# Patient Record
Sex: Male | Born: 1965
Health system: Southern US, Community
[De-identification: ages and names within clinical notes are randomized; demographics above are authoritative.]

## PROBLEM LIST (undated history)

## (undated) DIAGNOSIS — L309 Dermatitis, unspecified: Secondary | ICD-10-CM

## (undated) HISTORY — DX: Dermatitis, unspecified: L30.9

---

## 2016-10-19 ENCOUNTER — Encounter: Payer: Self-pay | Admitting: Medical

## 2016-10-19 ENCOUNTER — Ambulatory Visit (INDEPENDENT_AMBULATORY_CARE_PROVIDER_SITE_OTHER): Payer: Managed Care, Other (non HMO) | Admitting: Medical

## 2016-10-19 ENCOUNTER — Ambulatory Visit (HOSPITAL_BASED_OUTPATIENT_CLINIC_OR_DEPARTMENT_OTHER)
Admission: RE | Admit: 2016-10-19 | Discharge: 2016-10-19 | Disposition: A | Discharge status: Home / Self Care | Payer: Managed Care, Other (non HMO) | Source: Ambulatory Visit | Attending: Medical | Admitting: Medical

## 2016-10-19 VITALS — BP 148/90 | HR 91 | Temp 98.2°F | Resp 16 | Ht 72.0 in | Wt 243.6 lb

## 2016-10-19 DIAGNOSIS — M5136 Other intervertebral disc degeneration, lumbar region: Secondary | ICD-10-CM | POA: Diagnosis not present

## 2016-10-19 DIAGNOSIS — M5442 Lumbago with sciatica, left side: Secondary | ICD-10-CM

## 2016-10-19 DIAGNOSIS — G629 Polyneuropathy, unspecified: Secondary | ICD-10-CM | POA: Diagnosis not present

## 2016-10-19 DIAGNOSIS — R030 Elevated blood-pressure reading, without diagnosis of hypertension: Secondary | ICD-10-CM

## 2016-10-19 DIAGNOSIS — R5383 Other fatigue: Secondary | ICD-10-CM

## 2016-10-19 DIAGNOSIS — K429 Umbilical hernia without obstruction or gangrene: Secondary | ICD-10-CM

## 2016-10-19 DIAGNOSIS — F1010 Alcohol abuse, uncomplicated: Secondary | ICD-10-CM | POA: Diagnosis not present

## 2016-10-19 DIAGNOSIS — L309 Dermatitis, unspecified: Secondary | ICD-10-CM | POA: Diagnosis not present

## 2016-10-19 DIAGNOSIS — G8929 Other chronic pain: Secondary | ICD-10-CM

## 2016-10-19 LAB — CBC
HEMATOCRIT: 46.9 % (ref 39.0–52.0)
HEMOGLOBIN: 16.2 g/dL (ref 13.0–17.0)
MCHC: 34.6 g/dL (ref 30.0–36.0)
MCV: 97.7 fl (ref 78.0–100.0)
Platelets: 220 10*3/uL (ref 150.0–400.0)
RBC: 4.8 Mil/uL (ref 4.22–5.81)
RDW: 14 % (ref 11.5–15.5)
WBC: 8 10*3/uL (ref 4.0–10.5)

## 2016-10-19 LAB — TSH: TSH: 2.17 u[IU]/mL (ref 0.35–4.50)

## 2016-10-19 LAB — FOLATE: Folate: 5.7 ng/mL — ABNORMAL LOW (ref >5.9)

## 2016-10-19 LAB — VITAMIN B12: Vitamin B-12: 164 pg/mL — ABNORMAL LOW (ref 211–911)

## 2016-10-19 LAB — GAMMA GT: GGT: 76 U/L — AB (ref 7–51)

## 2016-10-19 NOTE — Patient Instructions (Addendum)
For your chronic  moderate to severe eczema history,  I am referring you to a dermatologist. You will probably need one of the newer type medications for eczema.  For your neuropathy and fatigue, I am ordering CBC, CMP, TSH, B12, B1, and folate level .  For your alcohol excess use/abuse recommend cutting back to 2-3 beers a day for short-term goal and possible complete abstinence. Will get blood work to assess liver enzymes.  For elevated blood pressure today recommend low salt diet and moderate exercise.  For your umbilical hernia will refer to general surgeon. If you get any severe constant high level pain in the hernia area then advised to be seen at the emergency department pending the surgeon referral  For your chronic midline lower back pain with radiating pain at times to the left leg will get lumbar spine x-ray. Will follow these x-rays and see if your pain worsens. If so might need to order MRI of the lumbar spine. If you ever gets severe leg pain, previous of legs, numbness between legs, foot drop or incontinence then be seen at the emergency department.  Follow-up in 2 weeks for blood pressure check. Get over-the-counter blood pressure cuff and check blood pressure daily. Try to avoid NSAIDs over-the-counter such as ibuprofen or Aleve since they can increase blood pressure. You can take Tylenol over-the-counter provided you decrease alcohol use and your liver enzymes on blood work are normal   DASH Eating Plan DASH stands for "Dietary Approaches to Stop Hypertension." The DASH eating plan is a healthy eating plan that has been shown to reduce high blood pressure (hypertension). It may also reduce your risk for type 2 diabetes, heart disease, and stroke. The DASH eating plan may also help with weight loss. What are tips for following this plan? General guidelines  Avoid eating more than 2,300 mg (milligrams) of salt (sodium) a day. If you have hypertension, you may need to reduce your  sodium intake to 1,500 mg a day.  Limit alcohol intake to no more than 1 drink a day for nonpregnant women and 2 drinks a day for men. One drink equals 12 oz of beer, 5 oz of wine, or 1 oz of hard liquor.  Work with your health care provider to maintain a healthy body weight or to lose weight. Ask what an ideal weight is for you.  Get at least 30 minutes of exercise that causes your heart to beat faster (aerobic exercise) most days of the week. Activities may include walking, swimming, or biking.  Work with your health care provider or diet and nutrition specialist (dietitian) to adjust your eating plan to your individual calorie needs. Reading food labels  Check food labels for the amount of sodium per serving. Choose foods with less than 5 percent of the Daily Value of sodium. Generally, foods with less than 300 mg of sodium per serving fit into this eating plan.  To find whole grains, look for the word "whole" as the first word in the ingredient list. Shopping  Buy products labeled as "low-sodium" or "no salt added."  Buy fresh foods. Avoid canned foods and premade or frozen meals. Cooking  Avoid adding salt when cooking. Use salt-free seasonings or herbs instead of table salt or sea salt. Check with your health care provider or pharmacist before using salt substitutes.  Do not fry foods. Cook foods using healthy methods such as baking, boiling, grilling, and broiling instead.  Cook with heart-healthy oils, such as olive, canola,  soybean, or sunflower oil. Meal planning   Eat a balanced diet that includes: ? 5 or more servings of fruits and vegetables each day. At each meal, try to fill half of your plate with fruits and vegetables. ? Up to 6-8 servings of whole grains each day. ? Less than 6 oz of lean meat, poultry, or fish each day. A 3-oz serving of meat is about the same size as a deck of cards. One egg equals 1 oz. ? 2 servings of low-fat dairy each day. ? A serving of  nuts, seeds, or beans 5 times each week. ? Heart-healthy fats. Healthy fats called Omega-3 fatty acids are found in foods such as flaxseeds and coldwater fish, like sardines, salmon, and mackerel.  Limit how much you eat of the following: ? Canned or prepackaged foods. ? Food that is high in trans fat, such as fried foods. ? Food that is high in saturated fat, such as fatty meat. ? Sweets, desserts, sugary drinks, and other foods with added sugar. ? Full-fat dairy products.  Do not salt foods before eating.  Try to eat at least 2 vegetarian meals each week.  Eat more home-cooked food and less restaurant, buffet, and fast food.  When eating at a restaurant, ask that your food be prepared with less salt or no salt, if possible. What foods are recommended? The items listed may not be a complete list. Talk with your dietitian about what dietary choices are best for you. Grains Whole-grain or whole-wheat bread. Whole-grain or whole-wheat pasta. Brown rice. Orpah Cobbatmeal. Quinoa. Bulgur. Whole-grain and low-sodium cereals. Pita bread. Low-fat, low-sodium crackers. Whole-wheat flour tortillas. Vegetables Fresh or frozen vegetables (raw, steamed, roasted, or grilled). Low-sodium or reduced-sodium tomato and vegetable juice. Low-sodium or reduced-sodium tomato sauce and tomato paste. Low-sodium or reduced-sodium canned vegetables. Fruits All fresh, dried, or frozen fruit. Canned fruit in natural juice (without added sugar). Meat and other protein foods Skinless chicken or Malawiturkey. Ground chicken or Malawiturkey. Pork with fat trimmed off. Fish and seafood. Egg whites. Dried beans, peas, or lentils. Unsalted nuts, nut butters, and seeds. Unsalted canned beans. Lean cuts of beef with fat trimmed off. Low-sodium, lean deli meat. Dairy Low-fat (1%) or fat-free (skim) milk. Fat-free, low-fat, or reduced-fat cheeses. Nonfat, low-sodium ricotta or cottage cheese. Low-fat or nonfat yogurt. Low-fat, low-sodium  cheese. Fats and oils Soft margarine without trans fats. Vegetable oil. Low-fat, reduced-fat, or light mayonnaise and salad dressings (reduced-sodium). Canola, safflower, olive, soybean, and sunflower oils. Avocado. Seasoning and other foods Herbs. Spices. Seasoning mixes without salt. Unsalted popcorn and pretzels. Fat-free sweets. What foods are not recommended? The items listed may not be a complete list. Talk with your dietitian about what dietary choices are best for you. Grains Baked goods made with fat, such as croissants, muffins, or some breads. Dry pasta or rice meal packs. Vegetables Creamed or fried vegetables. Vegetables in a cheese sauce. Regular canned vegetables (not low-sodium or reduced-sodium). Regular canned tomato sauce and paste (not low-sodium or reduced-sodium). Regular tomato and vegetable juice (not low-sodium or reduced-sodium). Rosita FirePickles. Olives. Fruits Canned fruit in a light or heavy syrup. Fried fruit. Fruit in cream or butter sauce. Meat and other protein foods Fatty cuts of meat. Ribs. Fried meat. Tomasa BlaseBacon. Sausage. Bologna and other processed lunch meats. Salami. Fatback. Hotdogs. Bratwurst. Salted nuts and seeds. Canned beans with added salt. Canned or smoked fish. Whole eggs or egg yolks. Chicken or Malawiturkey with skin. Dairy Whole or 2% milk, cream, and half-and-half. Whole  or full-fat cream cheese. Whole-fat or sweetened yogurt. Full-fat cheese. Nondairy creamers. Whipped toppings. Processed cheese and cheese spreads. Fats and oils Butter. Stick margarine. Lard. Shortening. Ghee. Bacon fat. Tropical oils, such as coconut, palm kernel, or palm oil. Seasoning and other foods Salted popcorn and pretzels. Onion salt, garlic salt, seasoned salt, table salt, and sea salt. Worcestershire sauce. Tartar sauce. Barbecue sauce. Teriyaki sauce. Soy sauce, including reduced-sodium. Steak sauce. Canned and packaged gravies. Fish sauce. Oyster sauce. Cocktail sauce. Horseradish  that you find on the shelf. Ketchup. Mustard. Meat flavorings and tenderizers. Bouillon cubes. Hot sauce and Tabasco sauce. Premade or packaged marinades. Premade or packaged taco seasonings. Relishes. Regular salad dressings. Where to find more information:  National Heart, Lung, and Blood Institute: PopSteam.is  American Heart Association: www.heart.org Summary  The DASH eating plan is a healthy eating plan that has been shown to reduce high blood pressure (hypertension). It may also reduce your risk for type 2 diabetes, heart disease, and stroke.  With the DASH eating plan, you should limit salt (sodium) intake to 2,300 mg a day. If you have hypertension, you may need to reduce your sodium intake to 1,500 mg a day.  When on the DASH eating plan, aim to eat more fresh fruits and vegetables, whole grains, lean proteins, low-fat dairy, and heart-healthy fats.  Work with your health care provider or diet and nutrition specialist (dietitian) to adjust your eating plan to your individual calorie needs. This information is not intended to replace advice given to you by your health care provider. Make sure you discuss any questions you have with your health care provider. Document Released: 01/25/2011 Document Revised: 01/30/2016 Document Reviewed: 01/30/2016 Elsevier Interactive Patient Education  2017 [employer].

## 2016-10-19 NOTE — Progress Notes (Signed)
Subjective:    Patient ID: Dennis Weaver, male    DOB: March 12, 1965, 51 y.o.   MRN: [id number]  HPI    Pt in for first time.  Pt works [employer],  Pt does not exercise. Pt not aware how many steps a day. Pt states he eats healthy. Non smoker. He does drink about 8 beers a day for about 20 years. Married- no children with current wife but kids with first marriage.  Hx of eczema on that occurs elbows, back of knee and anterior ankles. Pt had some prednisone in past and resolves briefly but then comes back. Conservative topical treatments did not help much. Pt was going to put him on new type med but then pt moved and never started treatments.  Pt states that his feet are numb at times. Pt numbness is for 3 years. Sometimes he almost looses balance since his feet are numb. No diabetes. Pt states blood work was negative 2 years ago. No calf cramping.   Some mild fatigue at times. Pt attributes to 3rd shift but he is not sure.   Bp mild elevated today. No cardiac or neurologic signs or symptoms.   Hx of back pain. State hx of back injury. Told in past disc issue. Hx of daily back pain. Pain for 2 years ago.   Hx of small umbliical hernia for 2 years. Occasional pain.     Review of Systems  Constitutional: Negative for chills, fatigue and fever.  Respiratory: Negative for cough, chest tightness, shortness of breath and wheezing.   Cardiovascular: Negative for chest pain and palpitations.  Gastrointestinal: Negative for abdominal pain, constipation, diarrhea, rectal pain and vomiting.  Neurological: Negative for dizziness, syncope, weakness, numbness and headaches.  Hematological: Negative for adenopathy. Does not bruise/bleed easily.       Objective:   Physical Exam   General Mental Status- Alert. General Appearance- Not in acute distress.   Skin General: Color- Normal Color. Moisture- Normal Moisture.  Neck Carotid Arteries- Normal color. Moisture- Normal  Moisture. No carotid bruits. No JVD.  Chest and Lung Exam Auscultation: Breath Sounds:-Normal.  Cardiovascular Auscultation:Rythm- Regular. Murmurs & Other Heart Sounds:Auscultation of the heart reveals- No Murmurs.  Abdomen Inspection:-Inspeection Normal. Palpation/Percussion:Note:No mass. Palpation and Percussion of the abdomen reveal- Non Tender, Non Distended + BS, no rebound or guarding. Small umbilical hernia.    Neurologic Cranial Nerve exam:- CN III-XII intact(No nystagmus), symmetric smile. Drift Test:- No drift. Romberg Exam:- Negative.  Heal to Toe Gait exam:-Normal. Finger to Nose:- Normal/Intact Strength:- 5/5 equal and symmetric strength both upper and lower extremities.  Back- mild mid lumbar spine tenderness to palpation. L5-S1 sensation intact bilaterally. But the bottom of his left foot is not numb. Right side only mildly numb.  Lower extremity-dorsalis pedis pulse and posterior tibial pulses intact. He does have some hair on both feet.     Assessment & Plan:  For your chronic  moderate to severe eczema history,  I am referring you to a dermatologist. You will probably need one of the newer type medications for eczema.  For your neuropathy and fatigue, I am ordering CBC, CMP, TSH, B12, B1, and folate level.  For your alcohol excess use/abuse recommend cutting back to 2-3 beers a day for short-term goal and possible complete abstinence. Will get blood work to assess liver enzymes.  For elevated blood pressure today recommend low salt diet and moderate exercise.  For your umbilical hernia will refer to general surgeon. If you get  any severe constant high level pain in the hernia area then advised to be seen at the emergency department pending the surgeon referral  For your chronic midline lower back pain with radiating pain at times to the left leg will get lumbar spine x-ray. Will follow these x-rays and see if your pain worsens. If so might need to order MRI  of the lumbar spine. If you ever gets severe leg pain, previous of legs, numbness between legs, foot drop or incontinence then be seen at the emergency department.  Follow-up in 2 weeks for blood pressure check. Get over-the-counter blood pressure cuff and check blood pressure daily. Try to avoid NSAIDs over-the-counter such as ibuprofen or Aleve since they can increase blood pressure. You can take Tylenol over-the-counter provided you decrease alcohol use and your liver enzymes on blood work are normal

## 2016-10-20 LAB — COMPLETE METABOLIC PANEL WITH GFR
ALT: 19 U/L (ref 9–46)
AST: 19 U/L (ref 10–35)
Albumin: 4.5 g/dL (ref 3.6–5.1)
Alkaline Phosphatase: 87 U/L (ref 40–115)
BILIRUBIN TOTAL: 0.4 mg/dL (ref 0.2–1.2)
BUN: 17 mg/dL (ref 7–25)
CO2: 22 mmol/L (ref 20–32)
Calcium: 9.8 mg/dL (ref 8.6–10.3)
Chloride: 103 mmol/L (ref 98–110)
Creat: 1.01 mg/dL (ref 0.70–1.33)
GFR, EST NON AFRICAN AMERICAN: 85 mL/min (ref >=60)
GFR, Est African American: >89 mL/min (ref >=60)
GLUCOSE: 87 mg/dL (ref 65–99)
Potassium: 4.3 mmol/L (ref 3.5–5.3)
SODIUM: 141 mmol/L (ref 135–146)
TOTAL PROTEIN: 7.6 g/dL (ref 6.1–8.1)

## 2016-10-23 LAB — VITAMIN B1: Vitamin B1 (Thiamine): 13 nmol/L (ref 8–30)

## 2016-10-25 ENCOUNTER — Ambulatory Visit (INDEPENDENT_AMBULATORY_CARE_PROVIDER_SITE_OTHER): Payer: Managed Care, Other (non HMO)

## 2016-10-25 DIAGNOSIS — E538 Deficiency of other specified B group vitamins: Secondary | ICD-10-CM | POA: Diagnosis not present

## 2016-10-25 MED ORDER — CYANOCOBALAMIN 1000 MCG/ML IJ SOLN
1000.0000 ug | Freq: Once | INTRAMUSCULAR | Status: AC
  Administered 2016-10-25: 1000 ug via INTRAMUSCULAR

## 2016-10-25 NOTE — Progress Notes (Addendum)
Pre visit review using our clinic tool,if applicable. No additional management support is needed unless otherwise documented below in the visit note.   Patient in for B12 injection per order from E. Saguier PA-C dated  10/23/16 due to patient having B-12 deficiency.  Given 1000 mcg IM left deltoid. Patient tolerated well. Return appointment given for 1 month per order.  Agree with administration of b12 im for low b12 level.  Saguier, Ramon DredgeEdward, PA-C

## 2016-11-05 ENCOUNTER — Encounter: Payer: Self-pay | Admitting: Medical

## 2016-11-05 ENCOUNTER — Ambulatory Visit (INDEPENDENT_AMBULATORY_CARE_PROVIDER_SITE_OTHER): Payer: Managed Care, Other (non HMO) | Admitting: Medical

## 2016-11-05 ENCOUNTER — Telehealth: Payer: Self-pay

## 2016-11-05 ENCOUNTER — Ambulatory Visit (HOSPITAL_BASED_OUTPATIENT_CLINIC_OR_DEPARTMENT_OTHER)
Admission: RE | Admit: 2016-11-05 | Discharge: 2016-11-05 | Disposition: A | Discharge status: Home / Self Care | Payer: Managed Care, Other (non HMO) | Source: Ambulatory Visit | Attending: Medical | Admitting: Medical

## 2016-11-05 VITALS — BP 150/90 | HR 98 | Temp 98.3°F | Resp 16 | Ht 74.0 in | Wt 244.0 lb

## 2016-11-05 DIAGNOSIS — M25559 Pain in unspecified hip: Secondary | ICD-10-CM

## 2016-11-05 DIAGNOSIS — M160 Bilateral primary osteoarthritis of hip: Secondary | ICD-10-CM | POA: Diagnosis not present

## 2016-11-05 DIAGNOSIS — I10 Essential (primary) hypertension: Secondary | ICD-10-CM

## 2016-11-05 MED ORDER — LOSARTAN POTASSIUM 50 MG PO TABS: 50.0000 mg | ORAL_TABLET | Freq: Every day | ORAL | 0 refills | Status: DC

## 2016-11-05 MED ORDER — TRAMADOL HCL 50 MG PO TABS: 50.0000 mg | ORAL_TABLET | Freq: Three times a day (TID) | ORAL | 0 refills | Status: DC | PRN

## 2016-11-05 MED FILL — traMADol HCL 50 MG TABS: 50 | 6 days supply | Qty: 16 | Fill #0

## 2016-11-05 MED FILL — LOSARTAN POTASSIUM 50 MG TA: 50 | 21 days supply | Qty: 21 | Fill #0

## 2016-11-05 NOTE — Telephone Encounter (Signed)
Faxed Tramadol to pt's pharmacy.

## 2016-11-05 NOTE — Telephone Encounter (Signed)
Pt states he forgot to ask PCP for medication neuropathy. Pt want to know if PCP can send something in today.

## 2016-11-05 NOTE — Progress Notes (Signed)
Subjective:    Patient ID: Dennis Weaver, male    DOB: 1965/06/03, 51 y.o.   MRN: [id number]  HPI  Pt in for a follow up. His bp was borderline 148/90.   Pt did get otc bp cuff. 138/87, 123/87,145/91, 122/83,121/82, 124/89, 135/105,134/98,134/93, 163/110 and 207/82. Today readings were elevated when MA and I checked. His bp in office with his machine was 140/111   Pt ha new machine from [facility].    Also notes some bilateral hip pain. Pain hurts a lot on standing. Rt side worse than left. No trauma or fall.   Review of Systems  Constitutional: Negative for chills, fatigue and fever.  Respiratory: Negative for cough, chest tightness and wheezing.   Cardiovascular: Negative for chest pain and palpitations.  Gastrointestinal: Negative for abdominal pain.  Musculoskeletal:       Rt hip pain.  Skin: Negative for rash.  Neurological: Negative for dizziness, speech difficulty, light-headedness and numbness.  Hematological: Negative for adenopathy. Does not bruise/bleed easily.  Psychiatric/Behavioral: Negative for behavioral problems, confusion, dysphoric mood, hallucinations and sleep disturbance. The patient is not nervous/anxious.     Past Medical History:  Diagnosis Date  . Eczema      Social History   Social History  . Marital status: Married    Spouse name: N/A  . Number of children: N/A  . Years of education: N/A   Occupational History  . Not on file.   Social History Main Topics  . Smoking status: Former Smoker    Packs/day: 0.50    Years: 20.00    Types: Cigarettes    Quit date: 10/19/2000  . Smokeless tobacco: Never Used  . Alcohol use 33.6 oz/week    56 Cans of beer per week  . Drug use: No  . Sexual activity: Yes   Other Topics Concern  . Not on file   Social History Narrative  . No narrative on file    No past surgical history on file.  Family History  Problem Relation Age of Onset  . Hepatitis Mother   . Cancer Father     No Known  Allergies  No current outpatient prescriptions on file prior to visit.   No current facility-administered medications on file prior to visit.     BP (!) 150/86   Pulse 98   Temp 98.3 F (36.8 C) (Oral)   Resp 16   Ht  (1.88 m)   Wt 244 lb (110.7 kg)   SpO2 99%   BMI 31.33 kg/m      Objective:   Physical Exam  General Mental Status- Alert. General Appearance- Not in acute distress.   Skin General: Color- Normal Color. Moisture- Normal Moisture.  Neck Carotid Arteries- Normal color. Moisture- Normal Moisture. No carotid bruits. No JVD.  Chest and Lung Exam Auscultation: Breath Sounds:-Normal.  Cardiovascular Auscultation:Rythm- Regular. Murmurs & Other Heart Sounds:Auscultation of the heart reveals- No Murmurs.  Abdomen Inspection:-Inspeection Normal. Palpation/Percussion:Note:No mass. Palpation and Percussion of the abdomen reveal- Non Tender, Non Distended + BS, no rebound or guarding.  Rt hip- pain on range of motion moderate. Stiff.  Left hip faint pain on range of motion.  Neurologic Cranial Nerve exam:- CN III-XII intact(No nystagmus), symmetric smile. Strength:- 5/5 equal and symmetric strength both upper and lower extremities.      Assessment & Plan:  For your high blood pressure today and other days when you check yourself ,  I will prescribe losartan  50 mg tablets.  Take tablet daily and check your blood pressure every other day. Please note some of your blood pressure readings are very low and only one was high. I will keep you on low-dose losartan into you follow-up. But during the interim if you see blood pressures over 140/90 consistently please go ahead and notify us.  For your hip pain please get x-rays. Knowing results of x-rays will help in determining what type of medication you would need. Currently use Tylenol only. Please avoid any NSAIDs.  After patient left, his x-rays did come back and showed severe osteoarthritis on right side  and moderate left hip osteoarthritis. With these findings I will advise him to continue Tylenol  But also prescribe tramadol. We'll go ahead and refer him to orthopedist.  He had mentioned some neuropathic type pain on the first visit with me. Tramadol may be helpful with this as well.    Follow-up in 2-3 weeks for nurse blood pressure check.  Ariston Grandison, Ramon Dredge, PA-C

## 2016-11-05 NOTE — Patient Instructions (Addendum)
For your high blood pressure today and other days when you check yourself ,  I will prescribe losartan  50 mg tablets.   Take tablet daily and check your blood pressure every other day. Please note some of your blood pressure readings are very low and only one was high. I will keep you on low-dose losartan into you follow-up. But during the interim if you see blood pressures over 140/90 consistently please go ahead and notify us.  For your hip pain please get x-rays. Knowing results of x-rays will help in determining what type of medication you would need. Currently use Tylenol only. Please avoid any NSAIDs.  After patient left, his x-rays did come back and showed severe osteoarthritis on right side and moderate left hip osteoarthritis. With these findings I will advise him to continue Tylenol  But also prescribe tramadol. We'll go ahead and refer him to orthopedist.  He had mentioned some neuropathic type pain on the first visit with me. Tramadol may be helpful with this as well.  Follow-up in 2-3 weeks for nurse blood pressure check.

## 2016-11-05 NOTE — Telephone Encounter (Signed)
Pt called in to follow up on request. I advised that we will be in touch once provider respond.   Please call pt when receive.

## 2016-11-07 ENCOUNTER — Telehealth: Payer: Self-pay | Admitting: Medical

## 2016-11-07 NOTE — Telephone Encounter (Signed)
Relation to UE:AVWU Call back number:[phone] Pharmacy:  Reason for call:  Patient states he works night and If possible please call within the hour, patient did state you can leave a detail message, please advise

## 2016-11-07 NOTE — Telephone Encounter (Signed)
Pt wants xray results. I sent result note. Will you notify him. Document he was advised.

## 2016-11-08 NOTE — Telephone Encounter (Signed)
Patient requesting call back, if he doesn't answer, you may leave detail message. Very anxious.

## 2016-11-08 NOTE — Telephone Encounter (Signed)
Pt.notified

## 2016-11-16 ENCOUNTER — Encounter (INDEPENDENT_AMBULATORY_CARE_PROVIDER_SITE_OTHER): Payer: Self-pay | Admitting: Orthopaedic Surgery

## 2016-11-16 ENCOUNTER — Ambulatory Visit (INDEPENDENT_AMBULATORY_CARE_PROVIDER_SITE_OTHER): Payer: Managed Care, Other (non HMO) | Admitting: Orthopaedic Surgery

## 2016-11-16 DIAGNOSIS — M1611 Unilateral primary osteoarthritis, right hip: Secondary | ICD-10-CM

## 2016-11-16 MED ORDER — DICLOFENAC SODIUM 75 MG PO TBEC: 75.0000 mg | DELAYED_RELEASE_TABLET | Freq: Two times a day (BID) | ORAL | 2 refills | Status: DC

## 2016-11-16 MED FILL — DICLOFENAC SOD EC 75 MG TAB: 75 | 15 days supply | Qty: 30 | Fill #0

## 2016-11-16 NOTE — Progress Notes (Signed)
Office Visit Note   Patient: Dennis Weaver           Date of Birth: [birth year]           MRN: [id number] Visit Date: 11/16/2016              Requested by: Esperanza Richters, PA-C [id number] [place], [place] [address] PCP: Esperanza Richters, PA-C   Assessment & Plan: Visit Diagnoses:  1. Primary osteoarthritis of right hip     Plan: Patient has severe degenerative joint disease of his right hip that has failed conservative treatment. At this point he has bone-on-bone changes on x-ray and significant difficulty with ADLs. He has constant pain. Diclofenac prescribed. Patient will call us back when he is ready to schedule surgery. We discussed the risks benefits alternatives to surgery. He understands and wishes to proceed.  Follow-Up Instructions: Return if symptoms worsen or fail to improve.   Orders:  No orders of the defined types were placed in this encounter.  Meds ordered this encounter  Medications  . diclofenac (VOLTAREN) 75 MG EC tablet    Sig: Take 1 tablet (75 mg total) by mouth 2 (two) times daily.    Dispense:  30 tablet    Refill:  2      Procedures: No procedures performed   Clinical Data: No additional findings.   Subjective: Chief Complaint  Patient presents with  . Right Hip - Pain  . Left Hip - Pain    Patient is a 51 year old gentleman who comes in with right greater than left hip pain for several years. He endorses significant pain and numbness and difficulty ambulating. He is not able to put on shoes because of hip pain. Tramadol has given partial and temporary relief. He has significant difficulty with ADLs and poor quality of life. 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    Review of Systems  Constitutional: Negative.   All other systems reviewed and are negative.    Objective: Vital Signs: There were no vitals taken for this visit.  Physical Exam  Constitutional: He is oriented to person,  place, and time. He appears well-developed and well-nourished.  HENT:  Head: Normocephalic and atraumatic.  Eyes: Pupils are equal, round, and reactive to light.  Neck: Neck supple.  Pulmonary/Chest: Effort normal.  Abdominal: Soft.  Musculoskeletal: Normal range of motion.  Neurological: He is alert and oriented to person, place, and time.  Skin: Skin is warm.  Psychiatric: He has a normal mood and affect. His behavior is normal. Judgment and thought content normal.  Nursing note and vitals reviewed.   Ortho Exam Right hip exam shows essentially no internal or external rotation. Positive Stinchfield sign. Lateral hip is nontender. Pain with logroll of the leg. Specialty Comments:  No specialty comments available.  Imaging: No results found.   PMFS History: There are no active problems to display for this patient.  Past Medical History:  Diagnosis Date  . Eczema     Family History  Problem Relation Age of Onset  . Hepatitis Mother   . Cancer Father     No past surgical history on file. Social History   Occupational History  . Not on file.   Social History Main Topics  . Smoking status: Former Smoker    Packs/day: 0.50    Years: 20.00    Types: Cigarettes    Quit date: 10/19/2000  . Smokeless tobacco: Never Used  .  Alcohol use 33.6 oz/week    56 Cans of beer per week  . Drug use: No  . Sexual activity: Yes

## 2016-11-22 ENCOUNTER — Ambulatory Visit: Payer: Managed Care, Other (non HMO)

## 2016-11-23 ENCOUNTER — Ambulatory Visit (INDEPENDENT_AMBULATORY_CARE_PROVIDER_SITE_OTHER): Payer: Managed Care, Other (non HMO) | Admitting: Medical

## 2016-11-23 VITALS — BP 138/89 | HR 78

## 2016-11-23 DIAGNOSIS — E538 Deficiency of other specified B group vitamins: Secondary | ICD-10-CM | POA: Diagnosis not present

## 2016-11-23 DIAGNOSIS — I10 Essential (primary) hypertension: Secondary | ICD-10-CM

## 2016-11-23 MED ORDER — CYANOCOBALAMIN 1000 MCG/ML IJ SOLN
1000.0000 ug | Freq: Once | INTRAMUSCULAR | Status: AC
  Administered 2016-11-23: 1000 ug via INTRAMUSCULAR

## 2016-11-23 MED ORDER — LOSARTAN POTASSIUM 50 MG PO TABS: 50.0000 mg | ORAL_TABLET | Freq: Every day | ORAL | 0 refills | Status: DC

## 2016-11-23 MED FILL — LOSARTAN POTASSIUM 50 MG TA: 50 | 90 days supply | Qty: 90 | Fill #0

## 2016-11-23 NOTE — Patient Instructions (Signed)
Per Esperanza Richters, PA-C: Continue current medication & regimen. Follow-up with PCP in 3 months.

## 2016-11-23 NOTE — Progress Notes (Signed)
Pre visit review using our clinic review tool, if applicable. No additional management support is needed unless otherwise documented below in the visit note.  Patient presents in office for blood pressure check & B12 injection per OV note 11/05/16 & Lab note 10/19/16. He voiced adherence to medication. Patient is asymptomatic; denies headaches, dizziness & lightheadedness. Today's readings were as follow: BP 138/89 P 78 & O2 97%.  Per Esperanza Richters, PA-C: Continue current medication & regimen. Follow-up with PCP in 3 months.  Patient was made aware of the provider's recommendations & verbalized understanding. Next appointments are 12/25/16 for B12 injection & 02/25/17 for BP follow-up.  Reviewed with RN and administration of B12 and agree with injection. Also discussed patient's blood pressure readings today and plan going forward.  Saguier, Ramon Dredge, PA-C

## 2016-11-30 MED FILL — DICLOFENAC SODIUM 75 MG TAB: 75 | 15 days supply | Qty: 30 | Fill #1

## 2016-12-17 ENCOUNTER — Telehealth (INDEPENDENT_AMBULATORY_CARE_PROVIDER_SITE_OTHER): Payer: Self-pay | Admitting: Orthopaedic Surgery

## 2016-12-17 MED FILL — DICLOFENAC SODIUM 75 MG TAB: 75 | 15 days supply | Qty: 30 | Fill #2

## 2016-12-17 NOTE — Telephone Encounter (Signed)
We can prescribe oral diclofenac for him.  75 mg bid.  Has he thought more about having a hip replacement?

## 2016-12-17 NOTE — Telephone Encounter (Signed)
Patient called saying that he was prescribed Voltaren for his hip pain which he said helped temporarily but now he's in more pain than he was in before and he was wondering if he could possibly take something else or if he needed to be seen. CB # [phone]

## 2016-12-17 NOTE — Telephone Encounter (Signed)
Please advise 

## 2016-12-19 NOTE — Telephone Encounter (Signed)
Called patient no answer.LMOM.  Need a pharmacy so I can send in medication.  And also has he thought about having the hip replacement?

## 2016-12-19 NOTE — Telephone Encounter (Signed)
Tried to call patient again no answer. Will call again later.

## 2016-12-20 MED ORDER — TRAMADOL HCL 50 MG PO TABS: ORAL_TABLET | ORAL | 0 refills | Status: DC

## 2016-12-20 MED FILL — traMADol HCL 50 MG TABS: 50 | 15 days supply | Qty: 60 | Fill #0

## 2016-12-20 NOTE — Telephone Encounter (Signed)
See message.

## 2016-12-20 NOTE — Telephone Encounter (Signed)
[facility]ed into [facility]. Pharmacy will call pt when ready.

## 2016-12-20 NOTE — Telephone Encounter (Signed)
Tramadol 50-100 mg bid prn #60

## 2016-12-20 NOTE — Addendum Note (Signed)
Addended by: Albertina ParrGARCIA, Shakeisha Horine on: 12/20/2016 01:20 PM   Modules accepted: Orders

## 2016-12-20 NOTE — Telephone Encounter (Signed)
Patient returned your call.  He stated that the Voltaren did not work for him and wanted to know if he could get something else.  He uses [facility] on [address].  He has thought about the hip replacement but can not take off for 3 months.  He is trying to work that out.  He works nights.  Okay to leave voicemail.  [phone].  Thank you

## 2016-12-25 ENCOUNTER — Ambulatory Visit: Payer: Managed Care, Other (non HMO)

## 2017-01-03 ENCOUNTER — Ambulatory Visit: Payer: Managed Care, Other (non HMO)

## 2017-01-16 ENCOUNTER — Ambulatory Visit (INDEPENDENT_AMBULATORY_CARE_PROVIDER_SITE_OTHER): Payer: Managed Care, Other (non HMO)

## 2017-01-16 DIAGNOSIS — E538 Deficiency of other specified B group vitamins: Secondary | ICD-10-CM | POA: Diagnosis not present

## 2017-01-16 MED ORDER — CYANOCOBALAMIN 1000 MCG/ML IJ SOLN
1000.0000 ug | Freq: Once | INTRAMUSCULAR | Status: AC
  Administered 2017-01-16: 1000 ug via INTRAMUSCULAR

## 2017-01-16 NOTE — Progress Notes (Signed)
Pre visit review using our clinic tool,if applicable. No additional management support is needed unless otherwise documented below in the visit note.   Patient in for B12 injection per [facility] from [facility] S[facility],PA-C due to patient having B12 deficiency.   No complaints voiced this visit. Given 1000 mcg IM left deltoid.  Patient tolerated well.

## 2017-02-15 ENCOUNTER — Ambulatory Visit: Payer: Managed Care, Other (non HMO)

## 2017-02-25 ENCOUNTER — Encounter: Payer: Self-pay | Admitting: Medical

## 2017-02-25 ENCOUNTER — Telehealth: Payer: Self-pay | Admitting: Medical

## 2017-02-25 ENCOUNTER — Ambulatory Visit: Payer: Managed Care, Other (non HMO) | Admitting: Medical

## 2017-02-25 VITALS — BP 140/90 | HR 82 | Temp 98.3°F | Resp 16 | Ht 74.0 in | Wt 232.4 lb

## 2017-02-25 DIAGNOSIS — Z1211 Encounter for screening for malignant neoplasm of colon: Secondary | ICD-10-CM

## 2017-02-25 DIAGNOSIS — M25559 Pain in unspecified hip: Secondary | ICD-10-CM | POA: Diagnosis not present

## 2017-02-25 DIAGNOSIS — I10 Essential (primary) hypertension: Secondary | ICD-10-CM | POA: Diagnosis not present

## 2017-02-25 MED ORDER — DICLOFENAC SODIUM 75 MG PO TBEC: 75.0000 mg | DELAYED_RELEASE_TABLET | Freq: Two times a day (BID) | ORAL | 2 refills | Status: AC

## 2017-02-25 MED ORDER — LOSARTAN POTASSIUM 100 MG PO TABS: 100.0000 mg | ORAL_TABLET | Freq: Every day | ORAL | 3 refills | Status: DC

## 2017-02-25 MED FILL — LOSARTAN POTASSIUM 100 MG T: 100 | 90 days supply | Qty: 90 | Fill #0

## 2017-02-25 MED FILL — DICLOFENAC SODIUM 75 MG TAB: 75 | 15 days supply | Qty: 30 | Fill #0

## 2017-02-25 NOTE — Telephone Encounter (Signed)
Copied from CRM [id number]. Topic: Quick Communication - See Telephone Encounter >> Feb 25, 2017  9:31 AM Windy KalataMichael, Deaaron Fulghum L, NT wrote: CRM for notification. See Telephone encounter for:  02/25/17.  Patient states he was seen today and Dr. Alvira MondaySaguier refilled all medications except his tramadol. Patient would like that called into [facility] [place] [facility].

## 2017-02-25 NOTE — Patient Instructions (Addendum)
For your hypertension consistently high will rx losartan 100 mg a day. Your bp has been consistently high and you did not take former rx. Please take this time.  For hip pain will rx diclofenac. Please be aware this can increase bp transiently so again please take bp med.  Pt declines flu vaccine today.  Hopefully you will not loose your job and if so will find one quickly. If you want to proceed with health maintenance let me know. Will go ahead and put in GI referral after discussion.  Follow up 3 months or as needed

## 2017-02-25 NOTE — Progress Notes (Signed)
Subjective:    Patient ID: Dennis Weaver, male    DOB: 18-Dec-1965, 52 y.o.   MRN: [id number]  HPI  Pt in for bp check.   His bp initially is borderline high. In past as well. No gross motor or sensory function deficits.   I rx'd cozaar and he did not take.  Pt has severe osteoarthritis of rt hip. Pt could not get surgery due to new job and he might be getting layed off. So surgery will likely be delayed.      Review of Systems  Constitutional: Negative for chills, fatigue and fever.  Respiratory: Negative for cough, chest tightness, shortness of breath and wheezing.   Cardiovascular: Negative for chest pain and palpitations.  Gastrointestinal: Negative for abdominal pain, diarrhea, nausea and vomiting.  Musculoskeletal: Negative for back pain and joint swelling.       Hip pains.  Skin: Negative for rash.  Neurological: Negative for dizziness, seizures, speech difficulty, weakness and headaches.  Hematological: Negative for adenopathy. Does not bruise/bleed easily.  Psychiatric/Behavioral: Negative for behavioral problems and decreased concentration.   Past Medical History:  Diagnosis Date  . Eczema      Social History   Socioeconomic History  . Marital status: Married    Spouse name: Not on file  . Number of children: Not on file  . Years of education: Not on file  . Highest education level: Not on file  Social Needs  . Financial resource strain: Not on file  . Food insecurity - worry: Not on file  . Food insecurity - inability: Not on file  . Transportation needs - medical: Not on file  . Transportation needs - non-medical: Not on file  Occupational History  . Not on file  Tobacco Use  . Smoking status: Former Smoker    Packs/day: 0.50    Years: 20.00    Pack years: 10.00    Types: Cigarettes    Last attempt to quit: 10/19/2000    Years since quitting: 16.3  . Smokeless tobacco: Never Used  Substance and Sexual Activity  . Alcohol use: Yes   Alcohol/week: 33.6 oz    Types: 56 Cans of beer per week  . Drug use: No  . Sexual activity: Yes  Other Topics Concern  . Not on file  Social History Narrative  . Not on file    No past surgical history on file.  Family History  Problem Relation Age of Onset  . Hepatitis Mother   . Cancer Father     No Known Allergies  Current Outpatient Medications on File Prior to Visit  Medication Sig Dispense Refill  . diclofenac (VOLTAREN) 75 MG EC tablet Take 1 tablet (75 mg total) by mouth 2 (two) times daily. 30 tablet 2  . losartan (COZAAR) 50 MG tablet Take 1 tablet (50 mg total) by mouth daily. 90 tablet 0  . traMADol (ULTRAM) 50 MG tablet Tramadol 50-100 mg bid prn 60 tablet 0   No current facility-administered medications on file prior to visit.     BP (!) 142/91   Pulse 82   Temp 98.3 F (36.8 C) (Oral)   Resp 16   Ht 6\' 2"  (1.88 m)   Wt 232 lb 6.4 oz (105.4 kg)   SpO2 99%   BMI 29.84 kg/m       Objective:   Physical Exam  General Mental Status- Alert. General Appearance- Not in acute distress.   Skin General: Color- Normal Color. Moisture- Normal Moisture.  Neck Carotid Arteries- Normal color. Moisture- Normal Moisture. No carotid bruits. No JVD.  Chest and Lung Exam Auscultation: Breath Sounds:-Normal.  Cardiovascular Auscultation:Rythm- Regular. Murmurs & Other Heart Sounds:Auscultation of the heart reveals- No Murmurs.  Abdomen Inspection:-Inspeection Normal. Palpation/Percussion:Note:No mass. Palpation and Percussion of the abdomen reveal- Non Tender, Non Distended + BS, no rebound or guarding.    Neurologic Cranial Nerve exam:- CN III-XII intact(No nystagmus), symmetric smile. Strength:- 5/5 equal and symmetric strength both upper and lower extremities.      Assessment & Plan:  For your hypertension consistently high will rx losartan 100 mg a day. Your bp has been consistently high and you did not take former rx. Please take this  time.  For hip pain will rx diclofenac. Please be aware this can increase bp transiently so again please take bp med.  Pt declines flu vaccine today.  Hopefully you will not loose your job and if so will find one quickly. If you want to proceed with health maintenance let me know. Will go ahead and put in GI referral after discussion.  Follow up 3 months or as needed  Santanna Whitford, Ramon DredgeEdward, VF CorporationPA-C

## 2017-02-26 NOTE — Telephone Encounter (Signed)
Let pt know I want him to come in and give uds and sign contract. Put in uds order and arrange for him to get that done this week. I can give him the tramadol script once he gives uds and signs contract(also run his [employer] profil). Also remind him that once on pain meds chronically he needs to be seen at least every 3 months.   Otherwise, explain I can only give him 5 day supply.

## 2017-02-28 NOTE — Telephone Encounter (Signed)
Left pt a message to call back. 

## 2017-03-04 ENCOUNTER — Other Ambulatory Visit: Payer: Managed Care, Other (non HMO)

## 2017-03-20 MED FILL — DICLOFENAC SODIUM 75 MG TAB: 75 | 15 days supply | Qty: 30 | Fill #1

## 2017-03-29 ENCOUNTER — Encounter: Payer: Self-pay | Admitting: Medical

## 2017-08-06 MED FILL — DICLOFENAC SOD EC 75 MG TAB: 75 | 15 days supply | Qty: 30 | Fill #2

## 2017-08-14 ENCOUNTER — Ambulatory Visit (INDEPENDENT_AMBULATORY_CARE_PROVIDER_SITE_OTHER): Payer: Self-pay | Admitting: Family

## 2017-08-14 ENCOUNTER — Encounter: Payer: Self-pay | Admitting: Family

## 2017-08-14 VITALS — BP 157/90 | HR 84 | Temp 97.9°F | Resp 16 | Ht 74.0 in | Wt 230.0 lb

## 2017-08-14 DIAGNOSIS — S39012A Strain of muscle, fascia and tendon of lower back, initial encounter: Secondary | ICD-10-CM

## 2017-08-14 DIAGNOSIS — L03032 Cellulitis of left toe: Secondary | ICD-10-CM

## 2017-08-14 DIAGNOSIS — R21 Rash and other nonspecific skin eruption: Secondary | ICD-10-CM

## 2017-08-14 DIAGNOSIS — I10 Essential (primary) hypertension: Secondary | ICD-10-CM

## 2017-08-14 MED ORDER — CEPHALEXIN 500 MG PO CAPS: 500.0000 mg | ORAL_CAPSULE | Freq: Three times a day (TID) | ORAL | 0 refills | Status: DC

## 2017-08-14 MED ORDER — CEPHALEXIN 500 MG PO CAPS: 500.0000 mg | ORAL_CAPSULE | Freq: Three times a day (TID) | ORAL | 0 refills | Status: AC

## 2017-08-14 MED ORDER — PREDNISONE 10 MG PO TABS: ORAL_TABLET | ORAL | 0 refills | Status: DC

## 2017-08-14 MED ORDER — LOSARTAN POTASSIUM 100 MG PO TABS: 100.0000 mg | ORAL_TABLET | Freq: Every day | ORAL | 3 refills | Status: DC

## 2017-08-14 MED ORDER — LOSARTAN POTASSIUM 100 MG PO TABS: 100.0000 mg | ORAL_TABLET | Freq: Every day | ORAL | 3 refills | Status: AC

## 2017-08-14 MED ORDER — FLUCONAZOLE 150 MG PO TABS: ORAL_TABLET | ORAL | 0 refills | Status: DC

## 2017-08-14 MED FILL — CEPHALEXIN 500 MG CAPSULE: 500 | 7 days supply | Qty: 21 | Fill #0

## 2017-08-14 MED FILL — predniSONE 10 MG TABS: 10 | 8 days supply | Qty: 20 | Fill #0

## 2017-08-14 MED FILL — LOSARTAN POTASSIUM 100 MG T: 100 | 30 days supply | Qty: 30 | Fill #0

## 2017-08-14 MED FILL — FLUCONAZOLE 150 MG TABS: 150 | 7 days supply | Qty: 2 | Fill #0

## 2017-08-14 NOTE — Progress Notes (Signed)
Subjective:    Patient ID: Dennis Weaver, male    DOB: 1965/08/24, 52 y.o.   MRN: [id number]  HPI   Dennis Weaver is a 52 yr old male who presents today with several concerns:  1) Foot pain-patient reports that he has been wearing steel toe boots recently at work. This has caused his "eczema" to flare up.    2) Back Pain- was picking his leg up and felt some pain in the lower back. This occurred yesterday. Pain is non-radiating.  3) Weight loss- reports improved diet.  Pt reports 30 pound weight loss over the last year. Our records indicate 14 pounds.  Wt Readings from Last 3 Encounters:  08/14/17 230 lb (104.3 kg)  02/25/17 232 lb 6.4 oz (105.4 kg)  11/05/16 244 lb (110.7 kg)   4) HTN- stopped bp med due to unemployment. He remains uninsured. BP Readings from Last 3 Encounters:  08/14/17 (!) 157/90  02/25/17 140/90  11/23/16 138/89      Review of Systems See HPI  Past Medical History:  Diagnosis Date  . Eczema      Social History   Socioeconomic History  . Marital status: Married    Spouse name: Not on file  . Number of children: Not on file  . Years of education: Not on file  . Highest education level: Not on file  Occupational History  . Not on file  Social Needs  . Financial resource strain: Not on file  . Food insecurity:    Worry: Not on file    Inability: Not on file  . Transportation needs:    Medical: Not on file    Non-medical: Not on file  Tobacco Use  . Smoking status: Former Smoker    Packs/day: 0.50    Years: 20.00    Pack years: 10.00    Types: Cigarettes    Last attempt to quit: 10/19/2000    Years since quitting: 16.8  . Smokeless tobacco: Never Used  Substance and Sexual Activity  . Alcohol use: Yes    Alcohol/week: 33.6 oz    Types: 56 Cans of beer per week  . Drug use: No  . Sexual activity: Yes  Lifestyle  . Physical activity:    Days per week: Not on file    Minutes per session: Not on file  . Stress: Not on file    Relationships  . Social connections:    Talks on phone: Not on file    Gets together: Not on file    Attends religious service: Not on file    Active member of club or organization: Not on file    Attends meetings of clubs or organizations: Not on file    Relationship status: Not on file  . Intimate partner violence:    Fear of current or ex partner: Not on file    Emotionally abused: Not on file    Physically abused: Not on file    Forced sexual activity: Not on file  Other Topics Concern  . Not on file  Social History Narrative  . Not on file    No past surgical history on file.  Family History  Problem Relation Age of Onset  . Hepatitis Mother   . Cancer Father     No Known Allergies  Current Outpatient Medications on File Prior to Visit  Medication Sig Dispense Refill  . diclofenac (VOLTAREN) 75 MG EC tablet Take 1 tablet (75 mg total) by mouth 2 (two) times  daily. 30 tablet 2  . losartan (COZAAR) 100 MG tablet Take 1 tablet (100 mg total) by mouth daily. (Patient not taking: Reported on 08/14/2017) 90 tablet 3  . traMADol (ULTRAM) 50 MG tablet Tramadol 50-100 mg bid prn (Patient not taking: Reported on 08/14/2017) 60 tablet 0   No current facility-administered medications on file prior to visit.     BP (!) 157/90 (BP Location: Right Arm, Cuff Size: Large)   Pulse 84   Temp 97.9 F (36.6 C) (Oral)   Resp 16   Ht 6\' 2"  (1.88 m)   Wt 230 lb (104.3 kg)   SpO2 97%   BMI 29.53 kg/m       Objective:   Physical Exam  Constitutional: He is oriented to person, place, and time. He appears well-developed and well-nourished. No distress.  HENT:  Head: Normocephalic and atraumatic.  Cardiovascular: Normal rate and regular rhythm.  No murmur heard. Pulmonary/Chest: Effort normal and breath sounds normal. No respiratory distress. He has no wheezes. He has no rales.  Musculoskeletal: He exhibits no edema.  Neurological: He is alert and oriented to person, place, and  time.  Skin: Skin is warm and dry.  erthematous annular plaques noted on bilateral dorsal feet Left second toe is swollen and erythematous with absent toenail, some serous drainage is noted.   Dry scaling patch noted on right upper arm  Psychiatric: He has a normal mood and affect. His behavior is normal. Thought content normal.          Assessment & Plan:  Skin rash- suspect foot rash is secondary to tinea pedis as well as severe eczema.  Psoriasis is another consideration and I have advised the patient that he should see a dermatologist when his insurance becomes active. Reports that he has responded to oral steroids in the past for his eczema.  Given associated back pain will rx with a prednisone taper. For suspected associated tinea pedis- rx with diflucan.  Cellulitis- left second toe. Rx with keflex. Recommended x-ray to rule out osteo.  He declines at this time due to cost.  Lumbar strain- rx with prednisone taper.  HTN- uncontrolled. Restart losartan, will need bmet with his insurance becomes active in August.

## 2017-08-14 NOTE — Patient Instructions (Signed)
Begin keflex for skin infection on your foot. Begin prednisone for back pain and eczema. Begin diflucan for fungal infection on your feet. Call if increased pain/swelling or if if you develop fever. Restart losartan for your blood pressure.  Follow up with Ramon DredgeEdward in 1 week.

## 2017-08-21 ENCOUNTER — Ambulatory Visit (INDEPENDENT_AMBULATORY_CARE_PROVIDER_SITE_OTHER): Payer: Self-pay | Admitting: Medical

## 2017-08-21 ENCOUNTER — Ambulatory Visit (HOSPITAL_BASED_OUTPATIENT_CLINIC_OR_DEPARTMENT_OTHER)
Admission: RE | Admit: 2017-08-21 | Discharge: 2017-08-21 | Disposition: A | Discharge status: Home / Self Care | Payer: Self-pay | Source: Ambulatory Visit | Attending: Medical | Admitting: Medical

## 2017-08-21 ENCOUNTER — Encounter: Payer: Self-pay | Admitting: Medical

## 2017-08-21 VITALS — BP 151/95 | HR 83 | Temp 98.2°F | Resp 16 | Ht 74.0 in | Wt 236.2 lb

## 2017-08-21 DIAGNOSIS — T148XXA Other injury of unspecified body region, initial encounter: Secondary | ICD-10-CM | POA: Insufficient documentation

## 2017-08-21 DIAGNOSIS — L0390 Cellulitis, unspecified: Secondary | ICD-10-CM | POA: Insufficient documentation

## 2017-08-21 DIAGNOSIS — L089 Local infection of the skin and subcutaneous tissue, unspecified: Secondary | ICD-10-CM | POA: Insufficient documentation

## 2017-08-21 DIAGNOSIS — X58XXXA Exposure to other specified factors, initial encounter: Secondary | ICD-10-CM | POA: Insufficient documentation

## 2017-08-21 DIAGNOSIS — I10 Essential (primary) hypertension: Secondary | ICD-10-CM

## 2017-08-21 LAB — COMPREHENSIVE METABOLIC PANEL
ALBUMIN: 4 g/dL (ref 3.5–5.2)
ALT: 30 U/L (ref 0–53)
AST: 26 U/L (ref 0–37)
Alkaline Phosphatase: 95 U/L (ref 39–117)
BILIRUBIN TOTAL: 0.4 mg/dL (ref 0.2–1.2)
BUN: 14 mg/dL (ref 6–23)
CO2: 27 meq/L (ref 19–32)
CREATININE: 0.75 mg/dL (ref 0.40–1.50)
Calcium: 9.9 mg/dL (ref 8.4–10.5)
Chloride: 100 mEq/L (ref 96–112)
GFR: 115.84 mL/min (ref >60.00)
Glucose, Bld: 112 mg/dL — ABNORMAL HIGH (ref 70–99)
Potassium: 5.1 mEq/L (ref 3.5–5.1)
Sodium: 136 mEq/L (ref 135–145)
Total Protein: 7.9 g/dL (ref 6.0–8.3)

## 2017-08-21 LAB — CBC WITH DIFFERENTIAL/PLATELET
BASOS ABS: 0.1 10*3/uL (ref 0.0–0.1)
BASOS PCT: 1 % (ref 0.0–3.0)
Eosinophils Absolute: 0.2 10*3/uL (ref 0.0–0.7)
Eosinophils Relative: 1.8 % (ref 0.0–5.0)
HEMATOCRIT: 43.2 % (ref 39.0–52.0)
HEMOGLOBIN: 14.6 g/dL (ref 13.0–17.0)
LYMPHS PCT: 17.4 % (ref 12.0–46.0)
Lymphs Abs: 1.5 10*3/uL (ref 0.7–4.0)
MCHC: 33.8 g/dL (ref 30.0–36.0)
MCV: 101.5 fl — AB (ref 78.0–100.0)
MONOS PCT: 9.6 % (ref 3.0–12.0)
Monocytes Absolute: 0.8 10*3/uL (ref 0.1–1.0)
NEUTROS ABS: 6.1 10*3/uL (ref 1.4–7.7)
Neutrophils Relative %: 70.2 % (ref 43.0–77.0)
PLATELETS: 306 10*3/uL (ref 150.0–400.0)
RBC: 4.26 Mil/uL (ref 4.22–5.81)
RDW: 16 % — AB (ref 11.5–15.5)
WBC: 8.7 10*3/uL (ref 4.0–10.5)

## 2017-08-21 MED ORDER — FLUCONAZOLE 150 MG PO TABS: ORAL_TABLET | ORAL | 0 refills | Status: AC

## 2017-08-21 MED ORDER — DOXYCYCLINE HYCLATE 100 MG PO TABS: 100.0000 mg | ORAL_TABLET | Freq: Two times a day (BID) | ORAL | 0 refills | Status: AC

## 2017-08-21 MED ORDER — SULFAMETHOXAZOLE-TRIMETHOPRIM 800-160 MG PO TABS: 1.0000 | ORAL_TABLET | Freq: Two times a day (BID) | ORAL | 0 refills | Status: AC

## 2017-08-21 MED ORDER — PREDNISONE 10 MG PO TABS: ORAL_TABLET | ORAL | 0 refills | Status: AC

## 2017-08-21 MED FILL — SULFAMETHOXAZOLE-TMP DS TAB: 800-160 | 7 days supply | Qty: 14 | Fill #0

## 2017-08-21 MED FILL — FLUCONAZOLE 150 MG TABS: 150 | 7 days supply | Qty: 2 | Fill #0

## 2017-08-21 MED FILL — DOXYCYCLINE HYCLATE 100 MG: 100 | 7 days supply | Qty: 14 | Fill #0

## 2017-08-21 MED FILL — predniSONE 10 MG TABS: 10 | 5 days supply | Qty: 15 | Fill #0

## 2017-08-21 NOTE — Patient Instructions (Addendum)
You do appear to have severe rash.  Possible severe eczema with secondary infection. You do report some improvement compared to last visit per your report.  However peeling of the skin today when you took your shoes off.  By exam concerned that left foot appears infected.  Also concern for breakdown/open wound at the tip of left second toe.  I counseled on benefit of referral to specialist but you had concern about cost.  My thought was probably best to refer you to wound care in light of physical exam.  If you change your mind please let me know.  We will prescribe both Bactrim DS and doxycycline antibiotic.  Wound culture sent out today.  Will order CBC and CMP today.  Also provide 5-day taper dosing of prednisone.  If your wounds worsen as we discussed today then recommend ED evaluation.  Follow-up on Monday with me.  If areas are not improving will again need to reconsider referral to specialist.  Understand your concern for cost but we need to proceed with caution.

## 2017-08-21 NOTE — Progress Notes (Signed)
Subjective:    Patient ID: Dennis Weaver, male    DOB: 27-Oct-1965, 52 y.o.   MRN: [id number]  HPI  Pt in with severe rash to both feet.   He states recent walking a lot at work and feet were sweating a lot.   He developed severe rash for over a month.(Reviewed NP note today)  2nd week of work the rash started. He was wearing new steel toe work boots.  Pt was treated for possible severe eczema and possible cellulitis.  He declined getting xray due to cost.     Review of Systems  Constitutional: Negative for chills, fatigue and fever.  Respiratory: Negative for chest tightness.   Musculoskeletal: Negative for back pain.       See foot exam.  Skin: Negative for pallor and rash.       See hpi and PE  Neurological: Negative for syncope, facial asymmetry, speech difficulty, weakness, light-headedness and numbness.  Hematological: Negative for adenopathy. Does not bruise/bleed easily.  Psychiatric/Behavioral: Negative for behavioral problems.    Past Medical History:  Diagnosis Date  . Eczema      Social History   Socioeconomic History  . Marital status: Married    Spouse name: Not on file  . Number of children: Not on file  . Years of education: Not on file  . Highest education level: Not on file  Occupational History  . Not on file  Social Needs  . Financial resource strain: Not on file  . Food insecurity:    Worry: Not on file    Inability: Not on file  . Transportation needs:    Medical: Not on file    Non-medical: Not on file  Tobacco Use  . Smoking status: Former Smoker    Packs/day: 0.50    Years: 20.00    Pack years: 10.00    Types: Cigarettes    Last attempt to quit: 10/19/2000    Years since quitting: 16.8  . Smokeless tobacco: Never Used  Substance and Sexual Activity  . Alcohol use: Yes    Alcohol/week: 33.6 oz    Types: 56 Cans of beer per week  . Drug use: No  . Sexual activity: Yes  Lifestyle  . Physical activity:    Days per week:  Not on file    Minutes per session: Not on file  . Stress: Not on file  Relationships  . Social connections:    Talks on phone: Not on file    Gets together: Not on file    Attends religious service: Not on file    Active member of club or organization: Not on file    Attends meetings of clubs or organizations: Not on file    Relationship status: Not on file  . Intimate partner violence:    Fear of current or ex partner: Not on file    Emotionally abused: Not on file    Physically abused: Not on file    Forced sexual activity: Not on file  Other Topics Concern  . Not on file  Social History Narrative  . Not on file    No past surgical history on file.  Family History  Problem Relation Age of Onset  . Hepatitis Mother   . Cancer Father     No Known Allergies  Current Outpatient Medications on File Prior to Visit  Medication Sig Dispense Refill  . cephALEXin (KEFLEX) 500 MG capsule Take 1 capsule (500 mg total) by mouth 3 (three)  times daily. 21 capsule 0  . diclofenac (VOLTAREN) 75 MG EC tablet Take 1 tablet (75 mg total) by mouth 2 (two) times daily. 30 tablet 2  . fluconazole (DIFLUCAN) 150 MG tablet One tablet by mouth today, repeat in 1 week. 2 tablet 0  . losartan (COZAAR) 100 MG tablet Take 1 tablet (100 mg total) by mouth daily. 30 tablet 3  . predniSONE (DELTASONE) 10 MG tablet 4 tabs by mouth once daily for 2 days, then 3 tabs daily x 2 days, then 2 tabs daily x 2 days, then 1 tab daily x 2 days 20 tablet 0   No current facility-administered medications on file prior to visit.     BP (!) 151/95   Pulse 83   Temp 98.2 F (36.8 C) (Oral)   Resp 16   Ht 6\' 2"  (1.88 m)   Wt 236 lb 3.2 oz (107.1 kg)   SpO2 100%   BMI 30.33 kg/m       Objective:   Physical Exam  General- No acute distress. Pleasant patient. Neck- Full range of motion, no jvd Lungs- Clear, even and unlabored. Heart- regular rate and rhythm. Neurologic- CNII- XII grossly intact.  Feet  exam-  Right  foot- scattered large scab with macerated appearance,  No swelling, no warmth.  Left foot- swollen foot. Moderate swelling, scabs present. Macerated appearance with peeling of epidermis in varius area. Open wound to second toe.       Assessment & Plan:  You do appear to have severe rash.  Possible severe eczema with secondary infection. You do report some improvement compared to last visit per your report.  However peeling of the skin today when you took your shoes off.  By exam concerned that left foot appears infected.  Also concern for breakdown/open wound at the tip of left second toe.  I counseled on benefit of referral to specialist but you concern  about cost.  My thought was probably best to refer you to wound care in light of physical exam.  If you change your mind please let me know.  We will prescribe both Bactrim DS and doxycycline antibiotic.  Wound culture sent out today.  Will order CBC and CMP today.  Also provide 5-day taper dosing of prednisone.  If your wounds worsen as we discussed today then recommend ED evaluation.  Follow-up on Monday with me.  If areas are not improving will again need to reconsider referral to specialist.  Understand your concern for cost but we need to proceed with caution.  Esperanza Richters, PA-C

## 2017-08-23 ENCOUNTER — Other Ambulatory Visit (INDEPENDENT_AMBULATORY_CARE_PROVIDER_SITE_OTHER): Payer: Self-pay

## 2017-08-23 DIAGNOSIS — R739 Hyperglycemia, unspecified: Secondary | ICD-10-CM

## 2017-08-23 LAB — WOUND CULTURE
MICRO NUMBER:: 90793001
SPECIMEN QUALITY:: ADEQUATE

## 2017-08-23 LAB — HEMOGLOBIN A1C: HEMOGLOBIN A1C: 5.1 % (ref 4.6–6.5)

## 2017-08-26 ENCOUNTER — Ambulatory Visit (INDEPENDENT_AMBULATORY_CARE_PROVIDER_SITE_OTHER): Payer: Self-pay | Admitting: Medical

## 2017-08-26 ENCOUNTER — Encounter: Payer: Self-pay | Admitting: Medical

## 2017-08-26 VITALS — BP 145/82 | HR 89 | Temp 98.3°F | Resp 16 | Ht 74.0 in | Wt 238.8 lb

## 2017-08-26 DIAGNOSIS — L089 Local infection of the skin and subcutaneous tissue, unspecified: Secondary | ICD-10-CM

## 2017-08-26 DIAGNOSIS — L0390 Cellulitis, unspecified: Secondary | ICD-10-CM

## 2017-08-26 DIAGNOSIS — T148XXA Other injury of unspecified body region, initial encounter: Secondary | ICD-10-CM

## 2017-08-26 MED ORDER — GABAPENTIN 100 MG PO CAPS: ORAL_CAPSULE | ORAL | 3 refills | Status: AC

## 2017-08-26 MED ORDER — TRAMADOL HCL 50 MG PO TABS: 50.0000 mg | ORAL_TABLET | Freq: Four times a day (QID) | ORAL | 0 refills | Status: AC | PRN

## 2017-08-26 MED ORDER — LEVOFLOXACIN 500 MG PO TABS: 500.0000 mg | ORAL_TABLET | Freq: Every day | ORAL | 0 refills | Status: AC

## 2017-08-26 MED FILL — GABAPENTIN 100 MG CAPSULE: 100 | 30 days supply | Qty: 90 | Fill #0

## 2017-08-26 MED FILL — levoFLOXacin 500 MG TABS: 500 | 7 days supply | Qty: 7 | Fill #0

## 2017-08-26 MED FILL — traMADol HCL 50 MG TABS: 50 | 5 days supply | Qty: 20 | Fill #0

## 2017-08-26 NOTE — Progress Notes (Signed)
Subjective:    Patient ID: Dennis Weaver, male    DOB: 1965/09/04, 52 y.o.   MRN: [id number]  HPI  Pt in with persistent symptoms to both feet as explained on last 2 visits.  See last visit.  Wound cultures came back negative except normal skin flora. No wbc present.  Pt sugar was not in diabetic range by a1c.  Pt infection fighting cells were not elevated. No wbc elevation  Left second toe small ulcer is some improved.   NO fever, no chills or sweats.  I rx'd bactrim DC and doxycycline pending study results. Also prescribed tapered dose of prednisone. That ended yesterday.  Pt did work 2 days last week.   Last visit hpi reads Pt in with severe rash to both feet.   He states recent walking a lot at work and feet were sweating a lot.   He developed severe rash for over a month.(Reviewed NP note today)  2nd week of work the rash started. He was wearing new steel toe work boots.  Pt was treated for possible severe eczema and possible cellulitis.  He declined getting xray due to cost.   Prior paritial  hpi on first presentation. 1) Foot pain-patient reports that he has been wearing steel toe boots recently at work. This has caused his "eczema" to flare up.    Review of Systems  Constitutional: Negative for chills, fatigue and fever.  Respiratory: Negative for cough and wheezing.   Cardiovascular: Negative for chest pain and leg swelling.  Musculoskeletal:       See hpi. And exam  Skin: Negative for rash.  Neurological: Negative for facial asymmetry and light-headedness.  Psychiatric/Behavioral: Negative for behavioral problems and confusion.    Past Medical History:  Diagnosis Date  . Eczema      Social History   Socioeconomic History  . Marital status: Married    Spouse name: Not on file  . Number of children: Not on file  . Years of education: Not on file  . Highest education level: Not on file  Occupational History  . Not on file  Social Needs    . Financial resource strain: Not on file  . Food insecurity:    Worry: Not on file    Inability: Not on file  . Transportation needs:    Medical: Not on file    Non-medical: Not on file  Tobacco Use  . Smoking status: Former Smoker    Packs/day: 0.50    Years: 20.00    Pack years: 10.00    Types: Cigarettes    Last attempt to quit: 10/19/2000    Years since quitting: 16.8  . Smokeless tobacco: Never Used  Substance and Sexual Activity  . Alcohol use: Yes    Alcohol/week: 33.6 oz    Types: 56 Cans of beer per week  . Drug use: No  . Sexual activity: Yes  Lifestyle  . Physical activity:    Days per week: Not on file    Minutes per session: Not on file  . Stress: Not on file  Relationships  . Social connections:    Talks on phone: Not on file    Gets together: Not on file    Attends religious service: Not on file    Active member of club or organization: Not on file    Attends meetings of clubs or organizations: Not on file    Relationship status: Not on file  . Intimate partner violence:  Fear of current or ex partner: Not on file    Emotionally abused: Not on file    Physically abused: Not on file    Forced sexual activity: Not on file  Other Topics Concern  . Not on file  Social History Narrative  . Not on file    No past surgical history on file.  Family History  Problem Relation Age of Onset  . Hepatitis Mother   . Cancer Father     No Known Allergies  Current Outpatient Medications on File Prior to Visit  Medication Sig Dispense Refill  . cephALEXin (KEFLEX) 500 MG capsule Take 1 capsule (500 mg total) by mouth 3 (three) times daily. 21 capsule 0  . diclofenac (VOLTAREN) 75 MG EC tablet Take 1 tablet (75 mg total) by mouth 2 (two) times daily. 30 tablet 2  . doxycycline (VIBRA-TABS) 100 MG tablet Take 1 tablet (100 mg total) by mouth 2 (two) times daily. 14 tablet 0  . fluconazole (DIFLUCAN) 150 MG tablet One tablet by mouth today, repeat in 1 week.  2 tablet 0  . losartan (COZAAR) 100 MG tablet Take 1 tablet (100 mg total) by mouth daily. 30 tablet 3  . predniSONE (DELTASONE) 10 MG tablet 5 TAB PO DAY 1 4 TAB PO DAY 2 3 TAB PO DAY 3 2 TAB PO DAY 4 1 TAB PO DAY 5 15 tablet 0  . sulfamethoxazole-trimethoprim (BACTRIM DS,SEPTRA DS) 800-160 MG tablet Take 1 tablet by mouth 2 (two) times daily. 14 tablet 0   No current facility-administered medications on file prior to visit.     BP (!) 145/82   Pulse 89   Temp 98.3 F (36.8 C) (Oral)   Resp 16   Ht 6\' 2"  (1.88 m)   Wt 238 lb 12.8 oz (108.3 kg)   SpO2 99%   BMI 30.66 kg/m       Objective:   Physical Exam  General- No acute distress. Pleasant patient. Neck- Full range of motion, no jvd Lungs- Clear, even and unlabored. Heart- regular rate and rhythm. Neurologic- CNII- XII grossly intact.   Feet exam-  Right  foot- scattered smaller  Scabs now compared to last visit with macerated appearance,  No swelling, no warmth.  Left foot- swollen foot. Moderate swelling, scabs present. Macerated appearance with peeling of epidermis in varius area. Open wound to second toe.(this looks like filling in some compared to prior.  Both feet good capillary refill.      Assessment & Plan:  You do appear to have infection on both feet with skin breakdown, but the left side does appear worse.   Unclear at this point if you have underlying dermatologic condition with secondary infection complication.  Did discuss case with Dr. Abner Greenspan today who came over and evaluated lower extremities.  We agree that you can stop both Bactrim and doxycycline.  We will switch you to levofloxacin antibiotic.  We considered a una  boot use but decided against that presently.  We will try to get you in with wound care as soon as possible.  Also trying to get you in with podiatry.  Try to keep your feet open to air as much as possible and use cotton socks.  I did write you a work note and will fill out  any FMLA type form if needed.  Follow-up with me in 5 to 7 days tentatively provided that referral to specialist are delayed.  Also again reminded that if you have worsening  or changing signs symptoms as explained then be seen in the emergency department.  Asked pt to update me by my chart on Wednesday if no one from wound has called him by then. Esperanza Richters, PA-C

## 2017-08-26 NOTE — Patient Instructions (Addendum)
You do appear to have infection on both feet with skin breakdown, but the left side does appear worse.  Unclear at this point if you have underlying dermatologic condition with secondary infection complication.  Did discuss case with Dr. Abner GreenspanBlyth today who came over and evaluated lower extremities.  We agree that you can stop both Bactrim and doxycycline.  We will switch you to levofloxacin antibiotic.  We considered a una boot use but decided against that presently.  We will try to get you in with wound care as soon as possible.  Also trying to get you in with podiatry.  Try to keep your feet open to air as much as possible and use cotton socks.  I did write you a work note and will fill out any FMLA type form if needed.  Follow-up with me in 5 to 7 days tentatively provided that referral to specialist are delayed.  Also again reminded that if you have worsening or changing signs symptoms as explained then be seen in the emergency department.

## 2017-11-03 ENCOUNTER — Emergency Department (HOSPITAL_BASED_OUTPATIENT_CLINIC_OR_DEPARTMENT_OTHER): Payer: BLUE CROSS/BLUE SHIELD

## 2017-11-03 ENCOUNTER — Other Ambulatory Visit: Payer: Self-pay

## 2017-11-03 ENCOUNTER — Emergency Department (HOSPITAL_BASED_OUTPATIENT_CLINIC_OR_DEPARTMENT_OTHER)
Admission: EM | Admit: 2017-11-03 | Discharge: 2017-11-03 | Disposition: A | Discharge status: Home / Self Care | Payer: BLUE CROSS/BLUE SHIELD | Attending: Emergency Medicine | Admitting: Emergency Medicine

## 2017-11-03 ENCOUNTER — Encounter (HOSPITAL_BASED_OUTPATIENT_CLINIC_OR_DEPARTMENT_OTHER): Payer: Self-pay | Admitting: *Deleted

## 2017-11-03 DIAGNOSIS — Z79899 Other long term (current) drug therapy: Secondary | ICD-10-CM | POA: Diagnosis not present

## 2017-11-03 DIAGNOSIS — Z87891 Personal history of nicotine dependence: Secondary | ICD-10-CM | POA: Insufficient documentation

## 2017-11-03 DIAGNOSIS — M25551 Pain in right hip: Secondary | ICD-10-CM

## 2017-11-03 MED ORDER — HYDROMORPHONE HCL 1 MG/ML IJ SOLN
2.0000 mg | Freq: Once | INTRAMUSCULAR | Status: AC
  Administered 2017-11-03: 2 mg via INTRAMUSCULAR
  Filled 2017-11-03: qty 2

## 2017-11-03 MED ORDER — HYDROCODONE-ACETAMINOPHEN 5-325 MG PO TABS: 1.0000 | ORAL_TABLET | Freq: Four times a day (QID) | ORAL | 0 refills | Status: AC | PRN

## 2017-11-03 NOTE — ED Triage Notes (Signed)
Right hip pain x 3 years but worst in the last 3 days

## 2017-11-03 NOTE — ED Provider Notes (Signed)
[facility] EMERGENCY DEPARTMENT Provider Note   CSN: [id number] Arrival date & time: 11/03/17  0909     History   Chief Complaint Chief Complaint  Patient presents with  . Hip pain    HPI Dennis Weaver is a 52 y.o. male.  Patient with acute exacerbation of right hip pain.  He has had chronic right hip pain.  Had been seen by [place] [facility] approximately about a year ago they recommended hip replacement.  Unfortunately patient lost job but now has insurance back.  Also has primary care doctor upstairs.  Orthopedics at that visit in September 2018 also did not recommend any further cortisone injections felt that they would not be helpful.  Patient without any new recent fall or injury.  Patient was also told that left hip probably will need to be done at some time in the future.  But the right hip was worse.     Past Medical History:  Diagnosis Date  . Eczema     There are no active problems to display for this patient.   History reviewed. No pertinent surgical history.      Home Medications    Prior to Admission medications   Medication Sig Start Date End Date Taking? Authorizing Provider  cephALEXin (KEFLEX) 500 MG capsule Take 1 capsule (500 mg total) by mouth 3 (three) times daily. 08/14/17   Sandford Craze, NP  diclofenac (VOLTAREN) 75 MG EC tablet Take 1 tablet (75 mg total) by mouth 2 (two) times daily. 02/25/17   Saguier, Ramon Dredge, PA-C  doxycycline (VIBRA-TABS) 100 MG tablet Take 1 tablet (100 mg total) by mouth 2 (two) times daily. 08/21/17   Saguier, Ramon Dredge, PA-C  fluconazole (DIFLUCAN) 150 MG tablet One tablet by mouth today, repeat in 1 week. 08/21/17   Saguier, Ramon Dredge, PA-C  gabapentin (NEURONTIN) 100 MG capsule 1 tab po q hs(may titrate up to 3 tabs if needed) 08/26/17   Saguier, Ramon Dredge, PA-C  HYDROcodone-acetaminophen (NORCO/VICODIN) 5-325 MG tablet Take 1-2 tablets by mouth every 6 (six) hours as needed for moderate pain. 11/03/17   Vanetta Mulders, MD  levofloxacin (LEVAQUIN) 500 MG tablet Take 1 tablet (500 mg total) by mouth daily. 08/26/17   Saguier, Ramon Dredge, PA-C  losartan (COZAAR) 100 MG tablet Take 1 tablet (100 mg total) by mouth daily. 08/14/17   Sandford Craze, NP  predniSONE (DELTASONE) 10 MG tablet 5 TAB PO DAY 1 4 TAB PO DAY 2 3 TAB PO DAY 3 2 TAB PO DAY 4 1 TAB PO DAY 5 08/21/17   Saguier, Ramon Dredge, PA-C  sulfamethoxazole-trimethoprim (BACTRIM DS,SEPTRA DS) 800-160 MG tablet Take 1 tablet by mouth 2 (two) times daily. 08/21/17   Saguier, Ramon Dredge, PA-C  traMADol (ULTRAM) 50 MG tablet Take 1 tablet (50 mg total) by mouth every 6 (six) hours as needed. 08/26/17   Saguier, Ramon Dredge, PA-C    Family History Family History  Problem Relation Age of Onset  . Hepatitis Mother   . Cancer Father     Social History Social History   Tobacco Use  . Smoking status: Former Smoker    Packs/day: 0.50    Years: 20.00    Pack years: 10.00    Types: Cigarettes    Last attempt to quit: 10/19/2000    Years since quitting: 17.0  . Smokeless tobacco: Never Used  Substance Use Topics  . Alcohol use: Yes    Alcohol/week: 8.0 standard drinks    Types: 8 Cans of beer per week  Comment: 6-8 cans of beer everyday  . Drug use: No     Allergies   Patient has no known allergies.   Review of Systems Review of Systems  Constitutional: Negative for fever.  HENT: Negative for congestion.   Eyes: Negative for redness.  Respiratory: Negative for shortness of breath.   Cardiovascular: Negative for chest pain.  Gastrointestinal: Negative for abdominal pain.  Genitourinary: Negative for dysuria.  Musculoskeletal: Positive for arthralgias. Negative for back pain.  Skin: Positive for rash.  Neurological: Negative for syncope.  Hematological: Does not bruise/bleed easily.  Psychiatric/Behavioral: Negative for confusion.     Physical Exam Updated Vital Signs BP (!) 141/91 (BP Location: Right Arm)   Pulse 90   Temp 98 F (36.7 C)  (Oral)   Resp 20   Wt 108.9 kg   SpO2 96%   BMI 30.81 kg/m   Physical Exam  Constitutional: He is oriented to person, place, and time. He appears well-developed and well-nourished. No distress.  HENT:  Head: Normocephalic and atraumatic.  Mouth/Throat: Oropharynx is clear and moist.  Eyes: Pupils are equal, round, and reactive to light. Conjunctivae and EOM are normal.  Neck: Neck supple.  Cardiovascular: Normal rate, regular rhythm and normal heart sounds.  Pulmonary/Chest: Effort normal and breath sounds normal. No respiratory distress.  Abdominal: Soft. Bowel sounds are normal. There is no tenderness.  Musculoskeletal: Normal range of motion. He exhibits edema.  Some bilateral lower extremity edema left greater than right.  Some evidence of varicose veins on the left side.  Patient also has a history of eczema and some of that is involving the top of both feet.  And the toes.  Patient with significant pain with range of motion of the right hip.  No erythema in that area no evidence of deformity.  Cap refill to toes is 2+.  Neurological: He is alert and oriented to person, place, and time. No cranial nerve deficit or sensory deficit. He exhibits normal muscle tone. Coordination normal.  Skin: Skin is warm.  Nursing note and vitals reviewed.    ED Treatments / Results  Labs (all labs ordered are listed, but only abnormal results are displayed) Labs Reviewed - No data to display  EKG None  Radiology Dg Hip Unilat W Or Wo Pelvis 2-3 Views Right  Result Date: 11/03/2017 CLINICAL DATA:  RIGHT hip pain for 3 years, worst in the last 3 days. EXAM: DG HIP (WITH OR WITHOUT PELVIS) 2-3V RIGHT COMPARISON:  None. 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 There is additional  degenerative osteoarthritis at the LEFT hip joint, moderate to severe in degree, also with abutment of the femoral head and superolateral acetabulum, also with articular surface sclerosis and mild degenerative subchondral cyst formation. No flattening of the LEFT femoral head. No acute appearing osseous abnormality. IMPRESSION: 1. 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 2. Additional degenerative osteoarthritis at the LEFT hip joint, moderate to severe in degree, as detailed above. 3. No acute findings. Electronically Signed   By: Bary RichardStan  Maynard M.D.   On: 11/03/2017 10:23    Procedures Procedures (including critical care time)  Medications Ordered in ED Medications  HYDROmorphone (DILAUDID) injection 2 mg (2 mg Intramuscular Given 11/03/17 1016)     Initial Impression / Assessment and Plan / ED Course  I have  reviewed the triage vital signs and the nursing notes.  Pertinent labs & imaging results that were available during my care of the patient were reviewed by me and considered in my medical decision making (see chart for details).     Symptoms consistent with his chronic hip pain with exacerbation.  Repeat x-rays done here today since he had not had any x-rays in a year.  We confirms significant degenerative disease of both hips with right being worse.  No acute bony injury.  Patient treated here with hydromorphone IM with some improvement in pain will be continued on hydrocodone at home follow back up with primary care doctor and [facility] orthopedic.  It appears that it is time to get the hip replacement done.  Final Clinical Impressions(s) / ED Diagnoses   Final diagnoses:  Pain of right hip joint    ED Discharge Orders         Ordered    HYDROcodone-acetaminophen (NORCO/VICODIN) 5-325 MG tablet  Every 6 hours PRN     11/03/17 1123           Vanetta Mulders, MD 11/03/17 1139

## 2017-11-03 NOTE — Discharge Instructions (Signed)
Follow-up with your primary care doctor.  X-rays show persistent degenerative hip problems.  Recommendation probably stillstands from [facility] a year ago it is time for hip replacement.  Take the pain medication as directed.  Work note provided.

## 2017-11-22 ENCOUNTER — Ambulatory Visit (INDEPENDENT_AMBULATORY_CARE_PROVIDER_SITE_OTHER): Payer: Managed Care, Other (non HMO) | Admitting: Orthopaedic Surgery

## 2018-11-29 IMAGING — DX DG FOOT COMPLETE 3+V*L*
3 series · 3 of 3 positions shown · non-contrast
Comparison: None.

CLINICAL DATA: Left foot ulcers.

EXAM:
LEFT FOOT - COMPLETE 3+ VIEW

[foot ap]
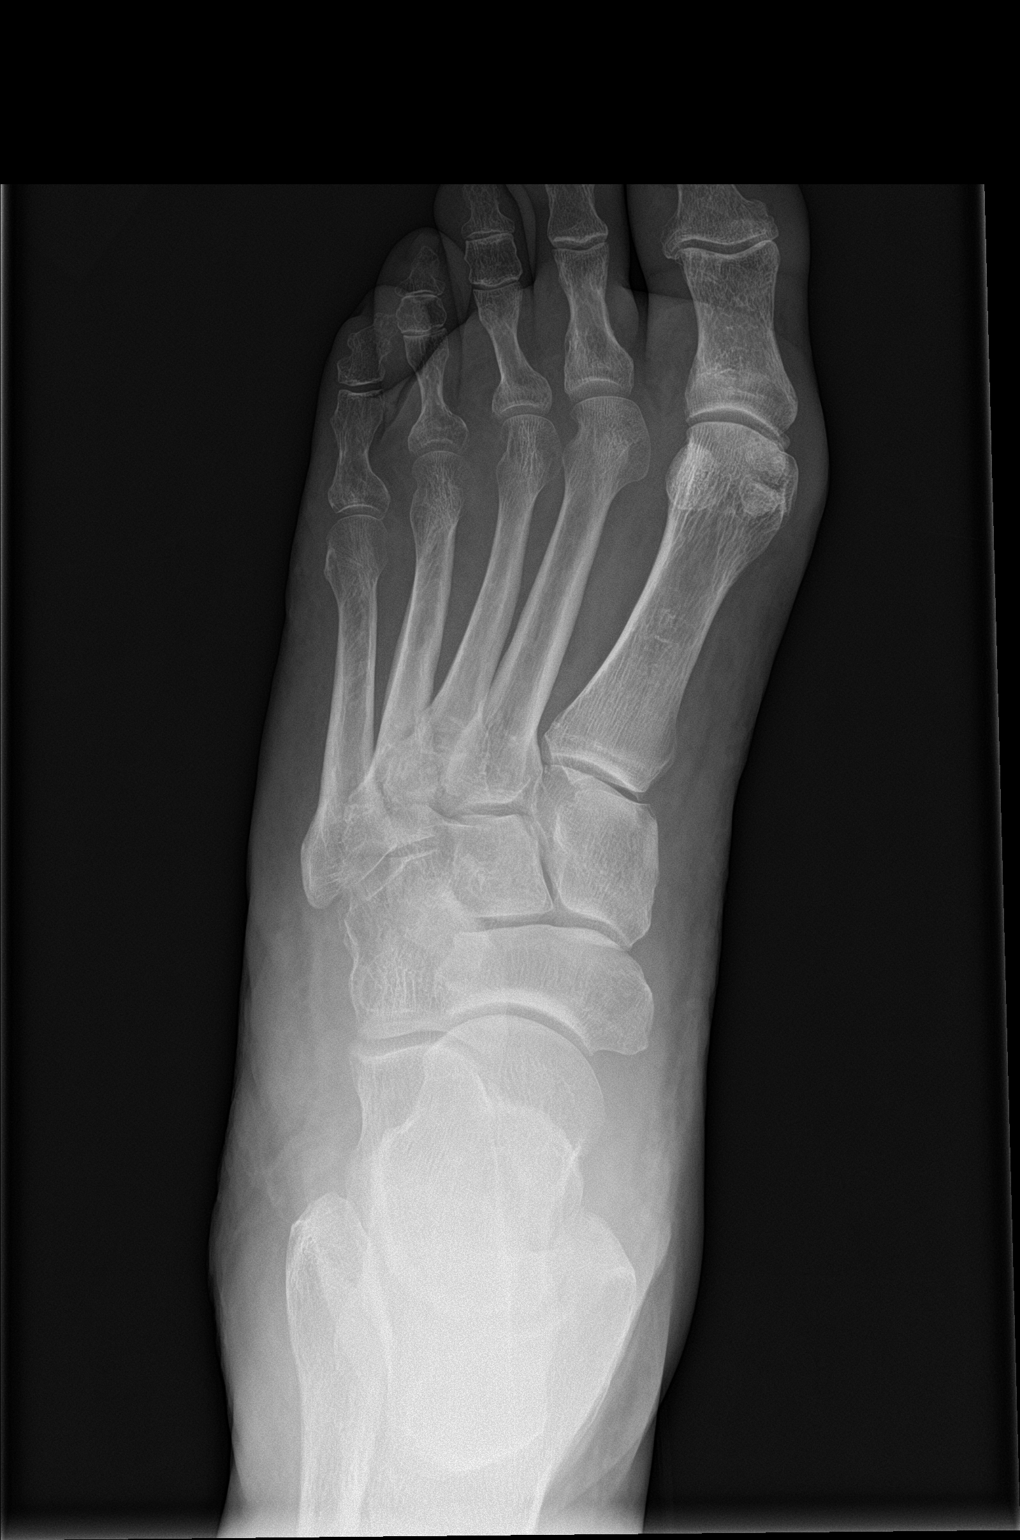

[foot obl]
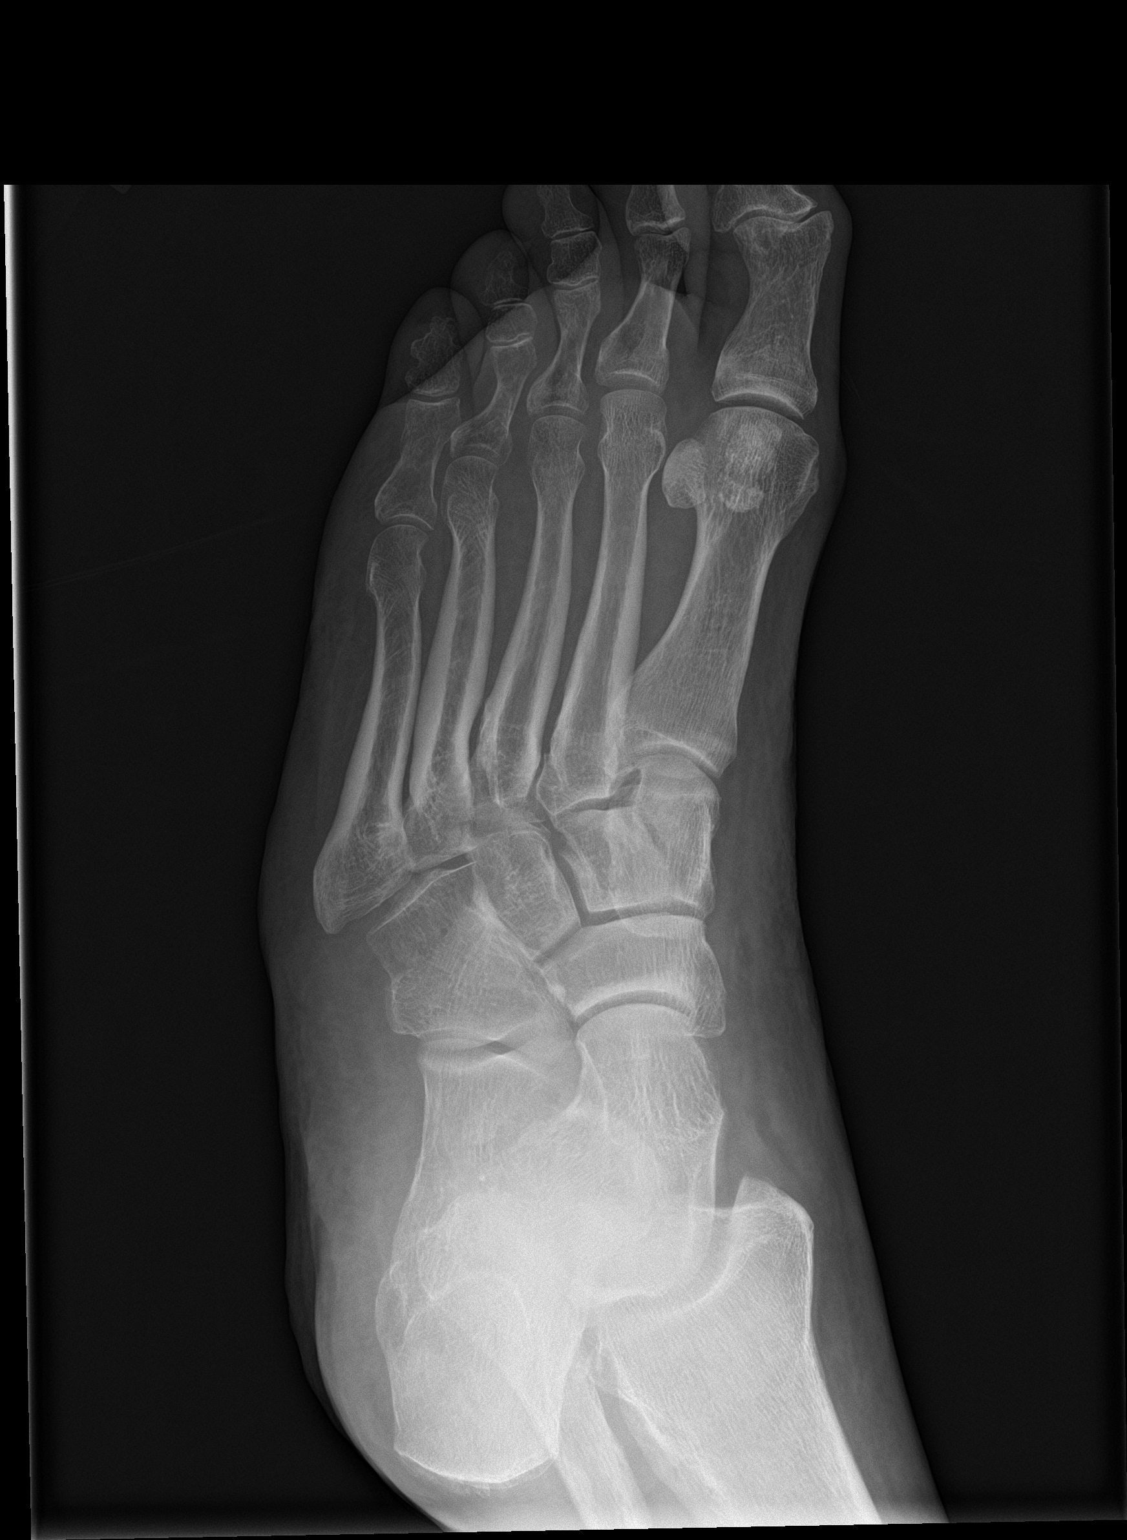

[foot lat]
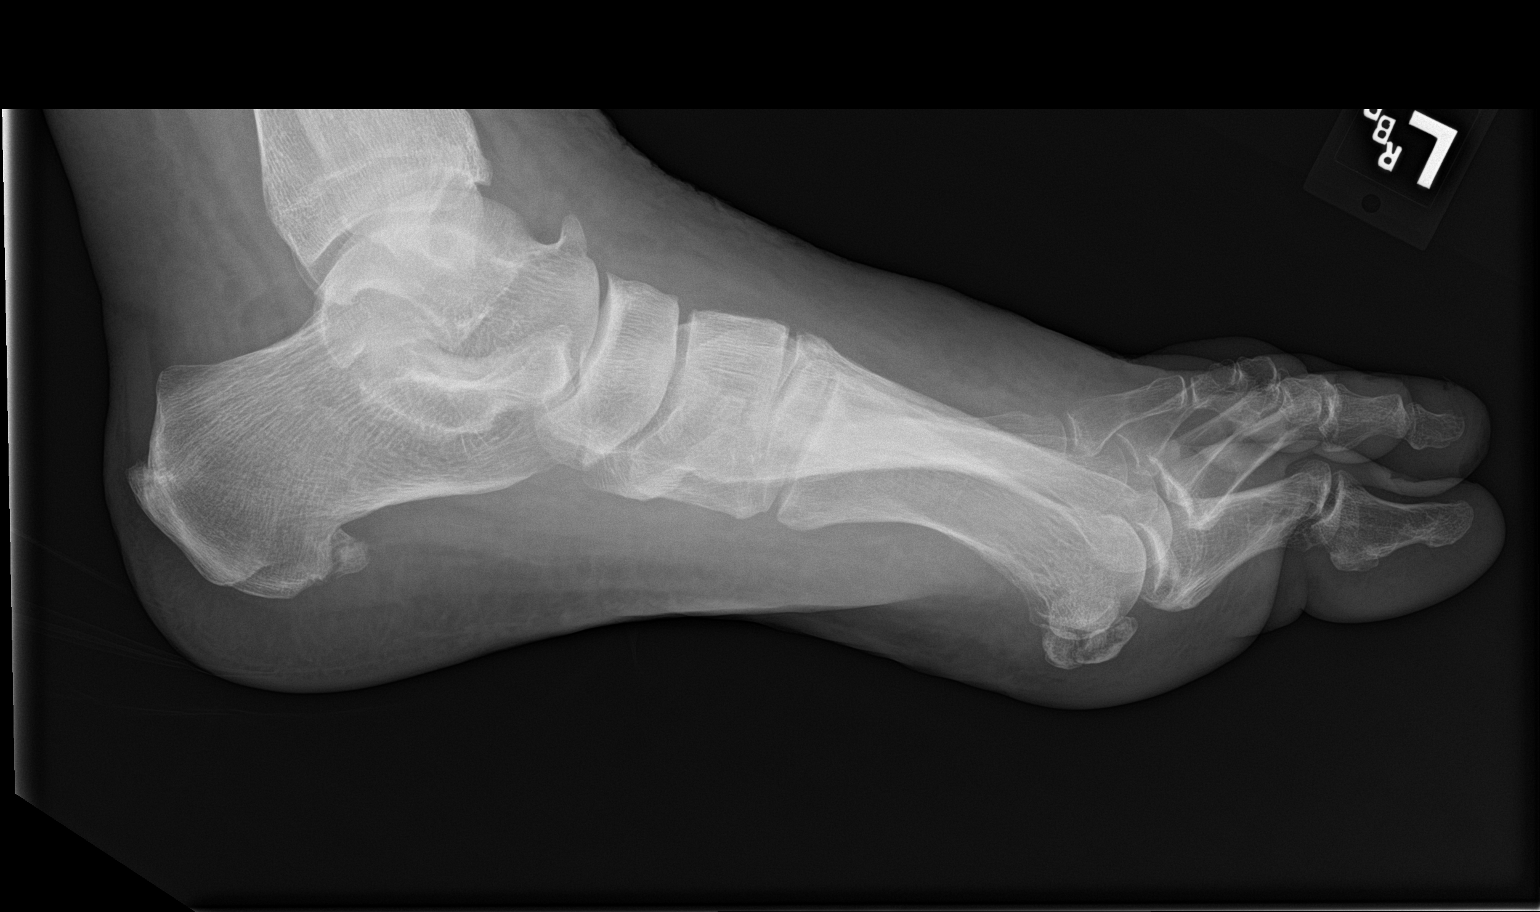

[3 of 3 positions shown; findings below may reference images not displayed]

FINDINGS: There is no evidence of fracture or dislocation. There is no
evidence of arthropathy. Mild posterior calcaneal spurring is noted.
Soft tissues are unremarkable.
IMPRESSION: No acute abnormality seen in the left foot. No lytic destruction is
seen to suggest osteomyelitis.

## 2019-02-11 IMAGING — CR DG HIP (WITH OR WITHOUT PELVIS) 2-3V*R*
3 series · 3 of 3 positions shown · non-contrast
Comparison: None.

CLINICAL DATA: RIGHT hip pain for 3 years, worst in the last 3
days.

EXAM:
DG HIP (WITH OR WITHOUT PELVIS) 2-3V RIGHT

[t pelvis a.p.]
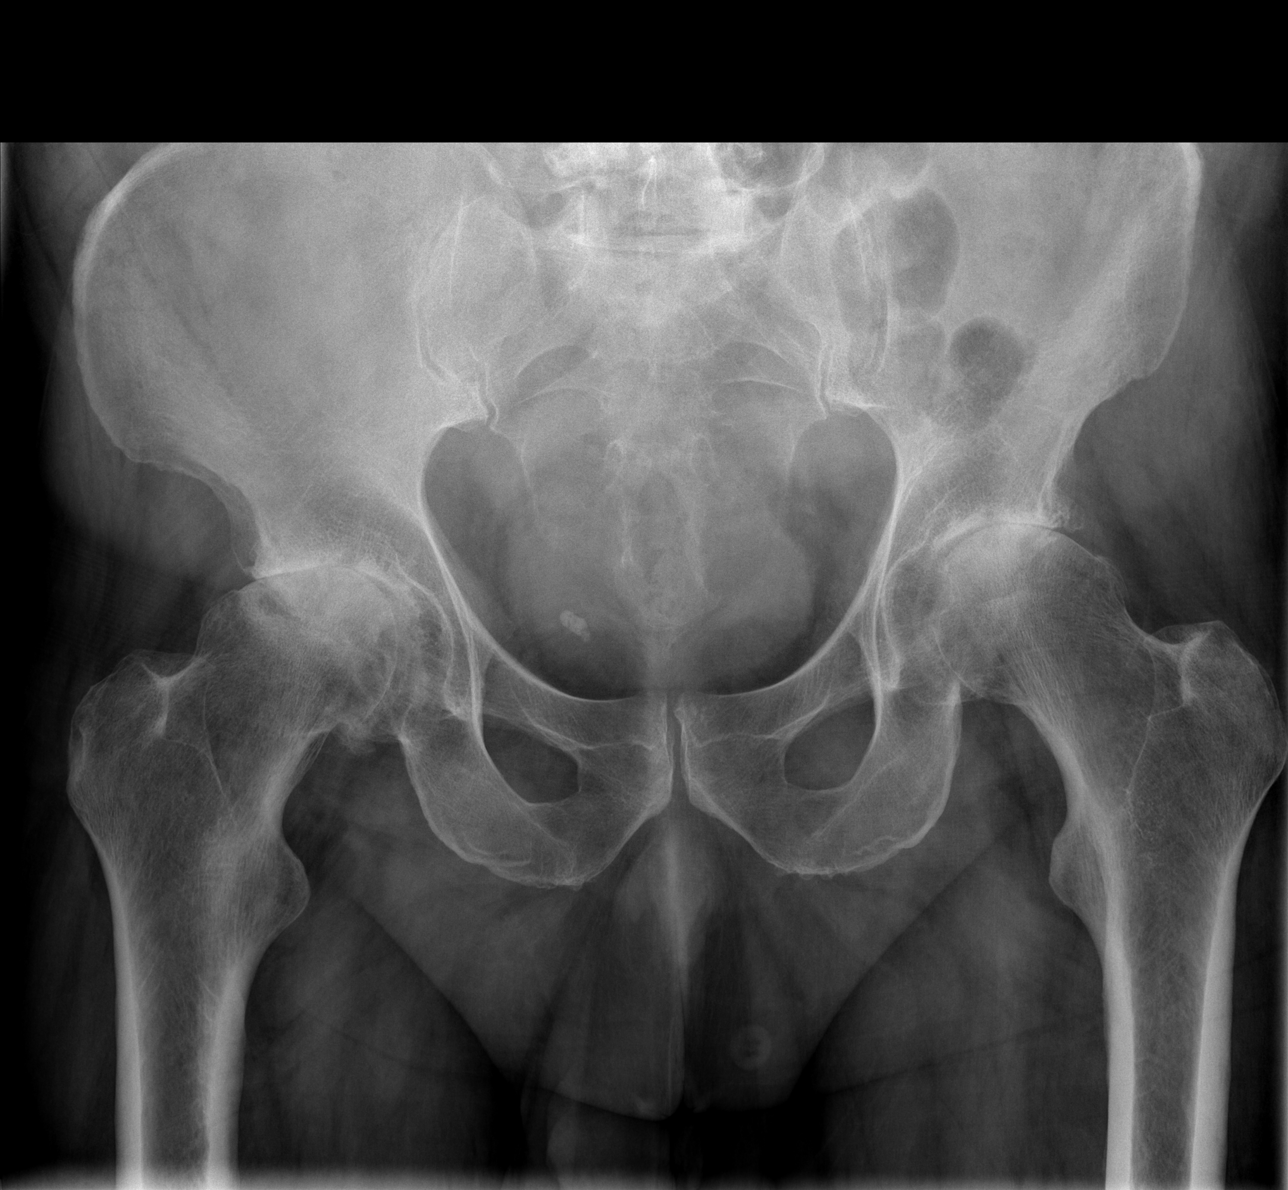

[t hip ap right]
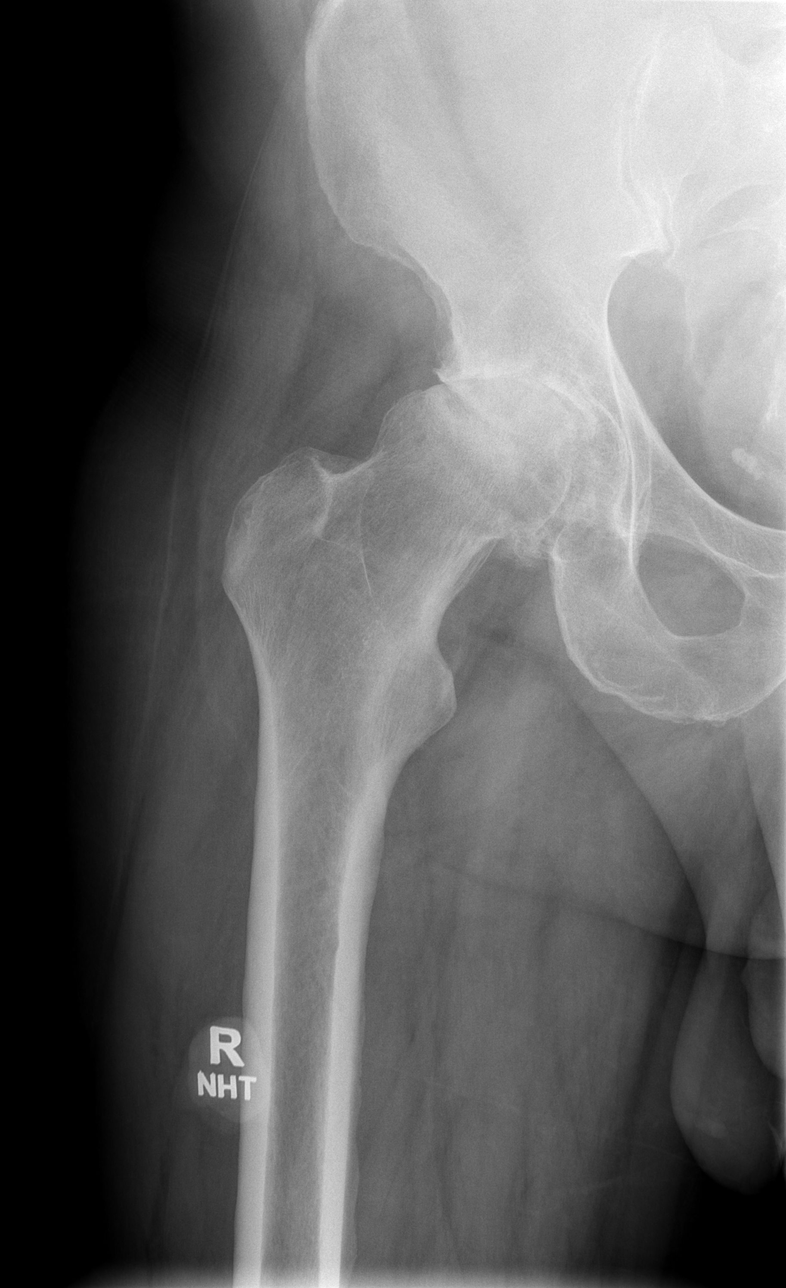

[t hip frog leg right]
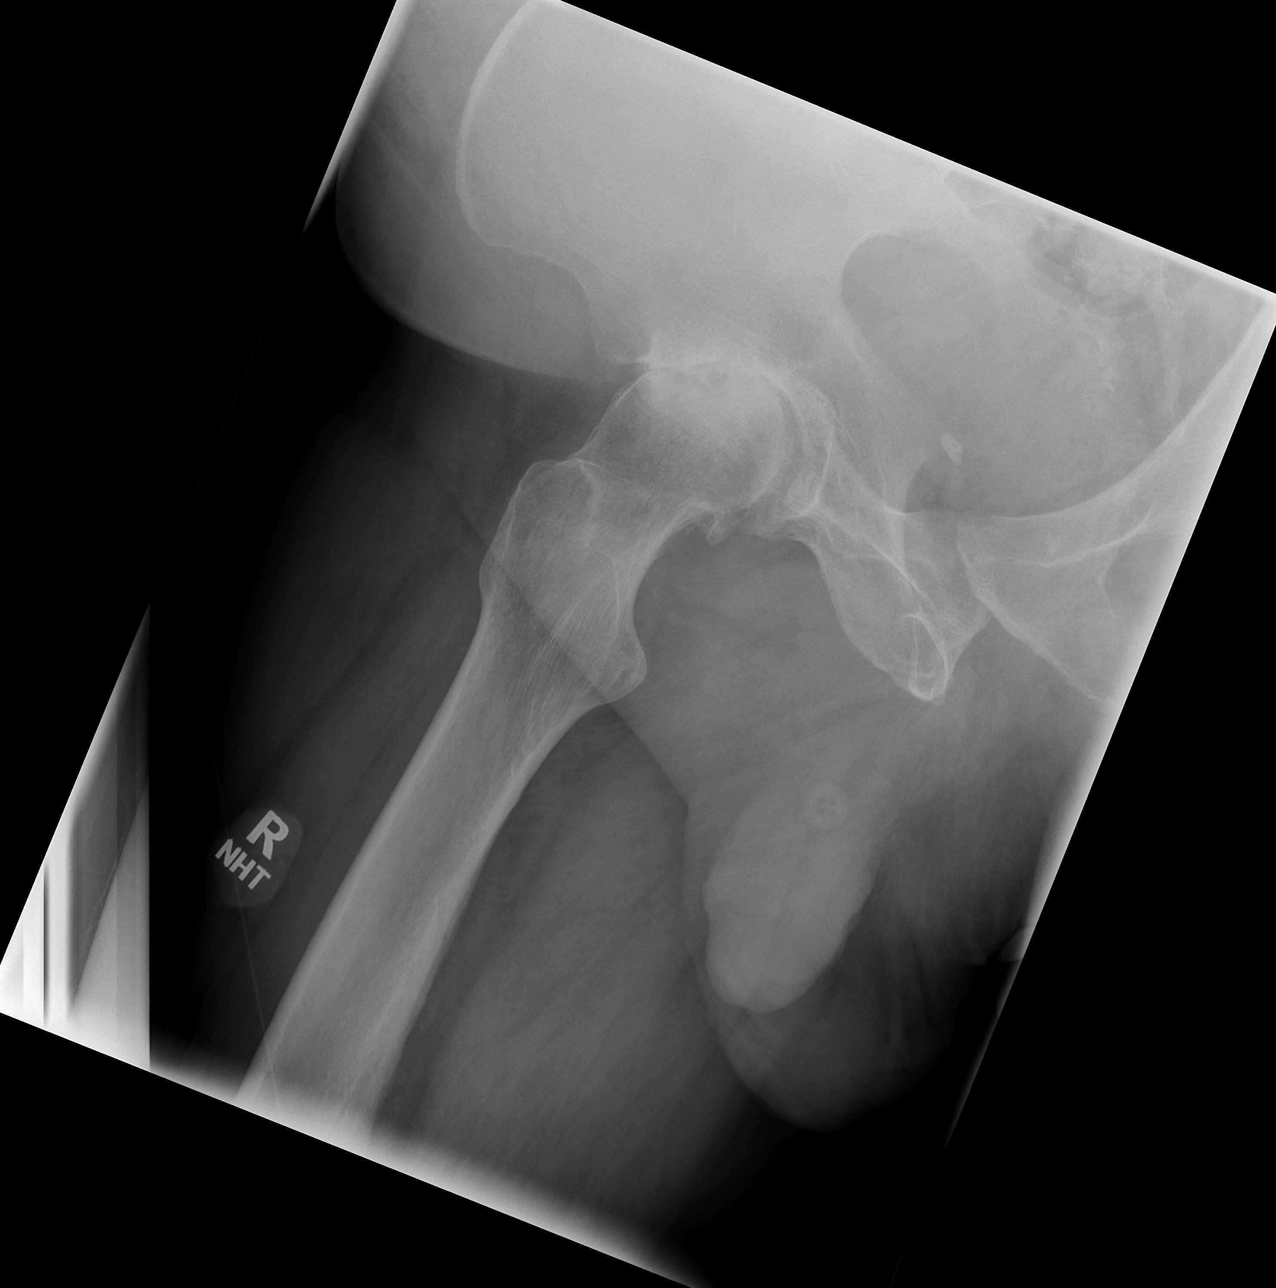

[3 of 3 positions shown; findings below may reference images not displayed]

FINDINGS: Severe degenerative osteoarthritis at the RIGHT hip joint, with
abutment of the femoral head and superolateral acetabulum, with
associated flattening of the femoral head, articular surface
sclerosis and degenerative subchondral cyst formation.

There is additional degenerative osteoarthritis at the LEFT hip
joint, moderate to severe in degree, also with abutment of the
femoral head and superolateral acetabulum, also with articular
surface sclerosis and mild degenerative subchondral cyst formation.
No flattening of the LEFT femoral head.

No acute appearing osseous abnormality.
IMPRESSION: 1. Severe degenerative osteoarthritis at the RIGHT hip joint, as
detailed above.
2. Additional degenerative osteoarthritis at the LEFT hip joint,
moderate to severe in degree, as detailed above.
3. No acute findings.
